# Patient Record
Sex: Female | Born: 1955 | Race: White | Hispanic: No | Smoking: Never smoker
Health system: Southern US, Community
[De-identification: ages and names within clinical notes are randomized; demographics above are authoritative.]

## PROBLEM LIST (undated history)

## (undated) DIAGNOSIS — T7840XA Allergy, unspecified, initial encounter: Secondary | ICD-10-CM

## (undated) DIAGNOSIS — R42 Dizziness and giddiness: Secondary | ICD-10-CM

## (undated) DIAGNOSIS — K5790 Diverticulosis of intestine, part unspecified, without perforation or abscess without bleeding: Secondary | ICD-10-CM

## (undated) DIAGNOSIS — G9332 Myalgic encephalomyelitis/chronic fatigue syndrome: Secondary | ICD-10-CM

## (undated) DIAGNOSIS — J45909 Unspecified asthma, uncomplicated: Secondary | ICD-10-CM

## (undated) DIAGNOSIS — I639 Cerebral infarction, unspecified: Secondary | ICD-10-CM

## (undated) DIAGNOSIS — R5382 Chronic fatigue, unspecified: Secondary | ICD-10-CM

## (undated) DIAGNOSIS — M1990 Unspecified osteoarthritis, unspecified site: Secondary | ICD-10-CM

## (undated) DIAGNOSIS — K529 Noninfective gastroenteritis and colitis, unspecified: Secondary | ICD-10-CM

## (undated) DIAGNOSIS — K219 Gastro-esophageal reflux disease without esophagitis: Secondary | ICD-10-CM

## (undated) DIAGNOSIS — M797 Fibromyalgia: Secondary | ICD-10-CM

## (undated) HISTORY — DX: Unspecified osteoarthritis, unspecified site: M19.90

## (undated) HISTORY — DX: Unspecified asthma, uncomplicated: J45.909

## (undated) HISTORY — DX: Gastro-esophageal reflux disease without esophagitis: K21.9

## (undated) HISTORY — DX: Diverticulosis of intestine, part unspecified, without perforation or abscess without bleeding: K57.90

## (undated) HISTORY — DX: Dizziness and giddiness: R42

## (undated) HISTORY — PX: TONSILLECTOMY: SUR1361

## (undated) HISTORY — DX: Allergy, unspecified, initial encounter: T78.40XA

---

## 1983-07-14 HISTORY — PX: CHOLECYSTECTOMY: SHX55

## 1997-12-11 ENCOUNTER — Emergency Department (HOSPITAL_COMMUNITY): Admission: EM | Admit: 1997-12-11 | Discharge: 1997-12-11 | Payer: Self-pay | Admitting: Internal Medicine

## 1998-04-30 ENCOUNTER — Ambulatory Visit (HOSPITAL_COMMUNITY): Admission: RE | Admit: 1998-04-30 | Discharge: 1998-04-30 | Payer: Self-pay | Admitting: *Deleted

## 1998-06-01 ENCOUNTER — Emergency Department (HOSPITAL_COMMUNITY): Admission: EM | Admit: 1998-06-01 | Discharge: 1998-06-01 | Payer: Self-pay | Admitting: Emergency Medicine

## 2001-07-28 ENCOUNTER — Emergency Department (HOSPITAL_COMMUNITY): Admission: EM | Admit: 2001-07-28 | Discharge: 2001-07-28 | Payer: Self-pay | Admitting: Emergency Medicine

## 2004-12-02 ENCOUNTER — Emergency Department (HOSPITAL_COMMUNITY): Admission: EM | Admit: 2004-12-02 | Discharge: 2004-12-02 | Payer: Self-pay | Admitting: *Deleted

## 2006-06-15 ENCOUNTER — Emergency Department (HOSPITAL_COMMUNITY): Admission: EM | Admit: 2006-06-15 | Discharge: 2006-06-15 | Payer: Self-pay | Admitting: Emergency Medicine

## 2007-06-30 ENCOUNTER — Emergency Department (HOSPITAL_COMMUNITY): Admission: EM | Admit: 2007-06-30 | Discharge: 2007-06-30 | Payer: Self-pay | Admitting: Emergency Medicine

## 2009-01-14 ENCOUNTER — Emergency Department (HOSPITAL_COMMUNITY): Admission: EM | Admit: 2009-01-14 | Discharge: 2009-01-15 | Payer: Self-pay | Admitting: Emergency Medicine

## 2009-10-14 ENCOUNTER — Ambulatory Visit: Payer: Self-pay | Admitting: Physician Assistant

## 2009-10-14 DIAGNOSIS — M1990 Unspecified osteoarthritis, unspecified site: Secondary | ICD-10-CM | POA: Insufficient documentation

## 2009-10-14 DIAGNOSIS — J309 Allergic rhinitis, unspecified: Secondary | ICD-10-CM | POA: Insufficient documentation

## 2009-10-14 DIAGNOSIS — R5382 Chronic fatigue, unspecified: Secondary | ICD-10-CM | POA: Insufficient documentation

## 2009-10-14 DIAGNOSIS — J45909 Unspecified asthma, uncomplicated: Secondary | ICD-10-CM | POA: Insufficient documentation

## 2009-10-14 DIAGNOSIS — IMO0001 Reserved for inherently not codable concepts without codable children: Secondary | ICD-10-CM | POA: Insufficient documentation

## 2009-10-14 DIAGNOSIS — N959 Unspecified menopausal and perimenopausal disorder: Secondary | ICD-10-CM | POA: Insufficient documentation

## 2009-11-05 ENCOUNTER — Encounter: Payer: Self-pay | Admitting: Physician Assistant

## 2009-11-15 ENCOUNTER — Telehealth: Payer: Self-pay | Admitting: Physician Assistant

## 2009-12-20 ENCOUNTER — Encounter: Payer: Self-pay | Admitting: Physician Assistant

## 2009-12-20 ENCOUNTER — Other Ambulatory Visit: Admission: RE | Admit: 2009-12-20 | Discharge: 2009-12-20 | Payer: Self-pay | Admitting: Internal Medicine

## 2009-12-20 ENCOUNTER — Ambulatory Visit: Payer: Self-pay | Admitting: Nurse Practitioner

## 2009-12-20 DIAGNOSIS — N951 Menopausal and female climacteric states: Secondary | ICD-10-CM | POA: Insufficient documentation

## 2009-12-20 LAB — CONVERTED CEMR LAB
Bilirubin Urine: NEGATIVE
Glucose, Urine, Semiquant: NEGATIVE
Ketones, urine, test strip: NEGATIVE
Nitrite: NEGATIVE
Protein, U semiquant: NEGATIVE
Specific Gravity, Urine: <1.005
WBC Urine, dipstick: NEGATIVE

## 2009-12-23 ENCOUNTER — Encounter: Payer: Self-pay | Admitting: Physician Assistant

## 2009-12-24 ENCOUNTER — Encounter (INDEPENDENT_AMBULATORY_CARE_PROVIDER_SITE_OTHER): Payer: Self-pay | Admitting: Nurse Practitioner

## 2009-12-24 LAB — CONVERTED CEMR LAB
ALT: 16 units/L (ref 0–35)
AST: 22 units/L (ref 0–37)
Albumin: 4.8 g/dL (ref 3.5–5.2)
Alkaline Phosphatase: 74 units/L (ref 39–117)
BUN: 14 mg/dL (ref 6–23)
Basophils Relative: 1 % (ref 0–1)
CO2: 19 meq/L (ref 19–32)
Calcium: 10.1 mg/dL (ref 8.4–10.5)
Creatinine, Ser: 0.99 mg/dL (ref 0.40–1.20)
GC Probe Amp, Genital: NEGATIVE
Glucose, Bld: 82 mg/dL (ref 70–99)
Lymphocytes Relative: 25 % (ref 12–46)
MCV: 90.3 fL (ref 78.0–100.0)
RDW: 13.2 % (ref 11.5–15.5)
TSH: 1.164 microintl units/mL (ref 0.350–4.500)
Total Bilirubin: 0.4 mg/dL (ref 0.3–1.2)
Total CHOL/HDL Ratio: 5.6
Total Protein: 7.4 g/dL (ref 6.0–8.3)
Triglycerides: 282 mg/dL — ABNORMAL HIGH (ref <150)
VLDL: 56 mg/dL — ABNORMAL HIGH (ref 0–40)
WBC: 7.1 10*3/uL (ref 4.0–10.5)

## 2009-12-27 ENCOUNTER — Encounter: Payer: Self-pay | Admitting: Physician Assistant

## 2009-12-30 ENCOUNTER — Encounter: Payer: Self-pay | Admitting: Physician Assistant

## 2009-12-31 ENCOUNTER — Telehealth: Payer: Self-pay | Admitting: Physician Assistant

## 2010-01-01 ENCOUNTER — Encounter: Payer: Self-pay | Admitting: Physician Assistant

## 2010-02-05 ENCOUNTER — Ambulatory Visit: Payer: Self-pay | Admitting: Physician Assistant

## 2010-02-05 ENCOUNTER — Encounter (INDEPENDENT_AMBULATORY_CARE_PROVIDER_SITE_OTHER): Payer: Self-pay | Admitting: Nurse Practitioner

## 2010-02-05 DIAGNOSIS — M722 Plantar fascial fibromatosis: Secondary | ICD-10-CM | POA: Insufficient documentation

## 2010-02-05 DIAGNOSIS — E785 Hyperlipidemia, unspecified: Secondary | ICD-10-CM

## 2010-02-05 LAB — CONVERTED CEMR LAB
ALT: 19 units/L (ref 0–35)
AST: 21 units/L (ref 0–37)
Bilirubin, Direct: 0.1 mg/dL (ref 0.0–0.3)
LDL Cholesterol: 94 mg/dL (ref 0–99)
Total Bilirubin: 0.4 mg/dL (ref 0.3–1.2)
Total CHOL/HDL Ratio: 4.2

## 2010-02-06 ENCOUNTER — Encounter (INDEPENDENT_AMBULATORY_CARE_PROVIDER_SITE_OTHER): Payer: Self-pay | Admitting: Nurse Practitioner

## 2010-02-21 ENCOUNTER — Encounter: Payer: Self-pay | Admitting: Physician Assistant

## 2010-06-24 ENCOUNTER — Telehealth (INDEPENDENT_AMBULATORY_CARE_PROVIDER_SITE_OTHER): Payer: Self-pay | Admitting: Internal Medicine

## 2010-08-03 ENCOUNTER — Encounter: Payer: Self-pay | Admitting: Internal Medicine

## 2010-08-12 NOTE — Letter (Signed)
Summary: Handout Printed  Printed Handout:  - Diet - Low-Cholesterol Guidelines 

## 2010-08-12 NOTE — Progress Notes (Signed)
Summary: Form  Phone Note Call from Patient   Summary of Call: The pt states that in her last visit she left a half disability form whit a clinical personnel that should go to the Tax department and she suppostly needs to have done by June so she was wondered if was ready.  Please call her back because nobody called her last time. Alben Spittle PA-c Initial call taken by: Manon Hilding,  December 31, 2009 10:14 AM  Follow-up for Phone Call        no answer, no machine Gaylyn Cheers RN  December 31, 2009 11:43 AM   Additional Follow-up for Phone Call Additional follow up Details #1::        Pt. notified form ready, she requested it be mailed to her @ [address] Manley Mason [phone], done. Additional Follow-up by: Gaylyn Cheers RN,  January 03, 2010 9:44 AM

## 2010-08-12 NOTE — Progress Notes (Signed)
Summary: allergist notes reviewed  Phone Note Outgoing Call   Summary of Call: Prior records reviewed from allergist. Records received not helpful. Asthma and allergies listed in problem list.  Initial call taken by: Brynda Rim,  Nov 15, 2009 3:31 PM

## 2010-08-12 NOTE — Letter (Signed)
Summary: TEST ORDER FORM//MAMMOGRAM//APPT DATE & TIME  TEST ORDER FORM//MAMMOGRAM//APPT DATE & TIME   Imported By: Arta Bruce 12/23/2009 09:28:41  _____________________________________________________________________  External Attachment:    Type:   Image     Comment:   External Document

## 2010-08-12 NOTE — Assessment & Plan Note (Signed)
Summary: 3 WEEK FU/PER NYKEDTRA//////KT   Vital Signs:  Patient profile:   55 year old female Menstrual status:  postmenopausal Height:      63 inches Weight:      139 pounds BMI:     24.71 Temp:     97.7 degrees F oral Pulse rate:   72 / minute Pulse rhythm:   regular Resp:     18 per minute BP sitting:   112 / 74  (left arm) Cuff size:   regular  Vitals Entered By: Armenia Shannon (February 05, 2010 2:07 PM) CC: f/u on chol. Is Patient Diabetic? No Pain Assessment Patient in pain? no       Does patient need assistance? Functional Status Self care Ambulation Normal   Primary Care Provider:  Brynda Rim  CC:  f/u on chol..  History of Present Illness: Here for f/u. Dorma Russell, NP for CPP.  Labs, Pap ok except high chol.  Started on pravastatin.  Here for labs.  Fasting today.  Never got Mammo.  Was put on clondine for hot flashes.  States no help.  Wants hormones.  Does have FHx of CAD (mom with MI in 89s and dad with MI in 78s).  No cigs ever.  No h/o clots.  She has not had a period in 3 years.  No vaginal bleeding.  Refused colo and Td at her CPP.  Still having hot flashes.  Mainly at bedtime.  Keeps her awake.  Cannot sleep.  She has had no improvement with the clonidine.  She has never tried effexor or gabapentin.  Notes a lot of pain from fibromyalgia.  When I first met her she did not want anything for treament.  She was afraid of taking prescription meds.    Notes bilat heel pain.  Worse in am.  Feels stiff.  Gets better with walking.  Problems Prior to Update: 1)  Plantar Fasciitis, Bilateral  (ICD-728.71) 2)  Hyperlipidemia  (ICD-272.4) 3)  Hot Flashes  (ICD-627.2) 4)  Unspecified Breast Screening  (ICD-V76.10) 5)  Routine Gynecological Examination  (ICD-V72.31) 6)  Allergic Rhinitis  (ICD-477.9) 7)  Preventive Health Care  (ICD-V70.0) 8)  Postmenopausal Syndrome  (ICD-627.9) 9)  Family History Diabetes 1st Degree Relative  (ICD-V18.0) 10)   Osteoarthritis  (ICD-715.90) 11)  Asthma  (ICD-493.90) 12)  Chronic Fatigue Syndrome  (ICD-780.71) 13)  Fibromyalgia  (ICD-729.1)  Current Medications (verified): 1)  Calcium 600 Mg Tabs (Calcium) .... Once Daily 2)  Vitamin D 1000 Unit Tabs (Cholecalciferol) .... Once Daily 3)  Proventil Hfa 108 (90 Base) Mcg/act Aers (Albuterol Sulfate) .Marland Kitchen.. 1-2 Puffs Every 4-6 Hours As Needed 4)  Cetirizine Hcl 10 Mg Tabs (Cetirizine Hcl) .... Take 1 Tablet By Mouth Once A Day As Needed For Allergies 5)  Flonase 50 Mcg/act Susp (Fluticasone Propionate) .Marland Kitchen.. 1-2 Sprays Each Nostril Once Daily 6)  Clonidine Hcl 0.1 Mg Tabs (Clonidine Hcl) .... One Tablet By Mouth Nightly For Hot Flashes 7)  Pravastatin Sodium 10 Mg Tabs (Pravastatin Sodium) .... One Tablet By Mouth Nightly For Cholesterol  Allergies (verified): 1)  ! Erythromycin  Physical Exam  General:  alert, well-developed, and well-nourished.   Head:  normocephalic and atraumatic.   Neck:  supple.   Lungs:  normal breath sounds.   Heart:  normal rate and regular rhythm.   Abdomen:  soft.   Msk:  + heel pain with palp bilat  Neurologic:  alert & oriented X3 and cranial nerves II-XII intact.  Psych:  normally interactive.     Impression & Recommendations:  Problem # 1:  HOT FLASHES (ICD-627.2) she does have remote FHx of CAD and has not had a mammo rec she get the mammo would like to avoid HRT if at all possible will try increasing clonidine . . . if no improvement, taper off and  she can try gabapentin . . . may help her fibromyalgia as well denies depression . . . does not want to try Effexor but may consider if gabapentin does not work  Problem # 2:  HYPERLIPIDEMIA (ICD-272.4)  Her updated medication list for this problem includes:    Pravastatin Sodium 10 Mg Tabs (Pravastatin sodium) ..... One tablet by mouth nightly for cholesterol  Orders: T-Lipid Profile (78295-62130) T-Hepatic Function (86578-46962)  Problem # 3:   FIBROMYALGIA (ICD-729.1) try gabapentin  Problem # 4:  PREVENTIVE HEALTH CARE (ICD-V70.0)  rec. she get colo done . . . patient declines she needs to get a mammo . . . has not done yet  Orders: T-Hemoccult Cards-Multiple (82270)  Problem # 5:  PLANTAR FASCIITIS, BILATERAL (ICD-728.71) home exercises given proper footwear discussed  Complete Medication List: 1)  Calcium 600 Mg Tabs (Calcium) .... Once daily 2)  Vitamin D 1000 Unit Tabs (Cholecalciferol) .... Once daily 3)  Proventil Hfa 108 (90 Base) Mcg/act Aers (Albuterol sulfate) .Marland Kitchen.. 1-2 puffs every 4-6 hours as needed 4)  Cetirizine Hcl 10 Mg Tabs (Cetirizine hcl) .... Take 1 tablet by mouth once a day as needed for allergies 5)  Flonase 50 Mcg/act Susp (Fluticasone propionate) .Marland Kitchen.. 1-2 sprays each nostril once daily 6)  Clonidine Hcl 0.1 Mg Tabs (Clonidine hcl) .... One tablet by mouth nightly for hot flashes 7)  Pravastatin Sodium 10 Mg Tabs (Pravastatin sodium) .... One tablet by mouth nightly for cholesterol 8)  Gabapentin 100 Mg Caps (Gabapentin) .... Take 1 cap at bedtime for 3 nights; then increase to 2 caps for 3 nights; then take 3 caps at bedtime every night.  Patient Instructions: 1)  Increase Clonidine to 2 tabs at bedtime.  If this does not work, decrease to 1 tab at bedtime for 2 days, then every other day for 2 doses, then stop. 2)  Then start the gabapentin as directed. 3)  Schedule follow up with Lorin Picket if the treatments we talked about do not work. 4)  Get your mammogram done. 5)  You are due for a colonoscopy.  Call us when you are ready to schedule. 6)  You should get  a tetanus shot.  It is done every 10 years.  Let us know when you are ready. 7)  Complete your hemoccult cards and return them soon.  8)  Do the home exercises for your feet and wear tennis shoes for cushioning at all times.  Flip flops and high heels make this worse. 9)  You can take Aleve 2 tabs two times a day for your foot  pain. Prescriptions: GABAPENTIN 100 MG CAPS (GABAPENTIN) Take 1 cap at bedtime for 3 nights; then increase to 2 caps for 3 nights; then take 3 caps at bedtime every night.  #90 x 3   Entered and Authorized by:   Tereso Newcomer PA-C   Signed by:   Tereso Newcomer PA-C on 02/05/2010   Method used:   Print then Give to Patient   RxID:   [id number]   Appended Document: hemoccult results  Laboratory Results    Stool - Occult Blood Hemmoccult #1: negative Date:  02/19/2010 Hemoccult #2: negative Date: 02/19/2010 Hemoccult #3: negative Date: 02/19/2010

## 2010-08-12 NOTE — Letter (Signed)
Summary: REQUESTING RECORDS FROM DR. BARDELAS  REQUESTING RECORDS FROM DR. BARDELAS   Imported By: Arta Bruce 12/02/2009 12:02:05  _____________________________________________________________________  External Attachment:    Type:   Image     Comment:   External Document

## 2010-08-12 NOTE — Letter (Signed)
Summary: Generic Letter  [facility]  [address] [place], [place] [address]   Phone: [phone]  Fax: [phone]    12/30/2009  To whom it may concern:  Ms. Latreece Mochizuki is a new patient to me.  I have seen her once in April 2011.  I have been given a copy of her adjudication for disability dated 11/23/2008 from the [employer] signed by an Warden/ranger judge.  She was felt to be fully favorable for disability.  With this information, I feel comfortable signing her "Certification of Disability for Property Tax Exclusion."  A copy of her judgement is retained in her chart.  If she needs further information provided or additional records signed, I will have to request she see a provider who specializes in disability determination.  If you have questions, please feel free to contact me.     Sincerely,   Tereso Newcomer PA-C

## 2010-08-12 NOTE — Assessment & Plan Note (Signed)
Summary: Complete Physcial Exam   Vital Signs:  Patient profile:   55 year old female Menstrual status:  postmenopausal Weight:      137.7 pounds BMI:     24.48 BSA:     1.65 Temp:     97.8 degrees F oral Pulse rate:   88 / minute Pulse rhythm:   regular Resp:     20 per minute BP sitting:   160 / 75  (left arm) Cuff size:   regular  Vitals Entered By: Levon Hedger (December 20, 2009 2:20 PM) CC: CPP Is Patient Diabetic? No Pain Assessment Patient in pain? yes     Location: body  Does patient need assistance? Functional Status Self care Ambulation Normal   Primary Care Provider:  Brynda Rim  CC:  CPP.  History of Present Illness:  Pt into the office for a complete physical exam  PAP - last PAP was over 3 years ago no hysterectomy last period was about 3 years Pt with complaints of hot flashes and night sweats.  She has tried over the counter therapies which have not been beneificial  Mammogram - last mammogram was over 20 years ago No family history of breast cancer No self breast exams at home  Optho - last eye exam was 10 months ago.  wears contacts all the time  Dental - last dental exam was 1 year ago       Habits & Providers  Alcohol-Tobacco-Diet     Alcohol drinks/day: 0     Tobacco Status: never  Exercise-Depression-Behavior     Does Patient Exercise: no     Have you felt down or hopeless? no     Have you felt little pleasure in things? no  Comments: PHQ-9 score = 11  Allergies (verified): 1)  ! Erythromycin  Social History: Does Patient Exercise:  no  Review of Systems General:  Complains of sweats; night sweats and hot flashes. Eyes:  Denies discharge. ENT:  Denies earache. CV:  Denies chest pain or discomfort. Resp:  Denies cough. GI:  Denies abdominal pain, nausea, and vomiting. GU:  Denies discharge. MS:  Complains of muscle aches and thoracic pain; denies joint pain. Derm:  Denies flushing. Neuro:  Denies  headaches. Psych:  Complains of anxiety. Endo:  Denies excessive thirst and excessive urination.  Physical Exam  General:  alert.   Head:  normocephalic.   Eyes:  pupils equal and pupils round.   Ears:  ear piercing(s) noted.  bil TM visible Nose:  no nasal discharge.   Mouth:  pharynx pink and moist and fair dentition.   Neck:  supple.   Chest Wall:  no mass.   Breasts:  no abnormal thickening.   Lungs:  normal breath sounds.   Heart:  normal rate and regular rhythm.   Abdomen:  soft, non-tender, and normal bowel sounds.   Rectal:  external hemorrhoid(s).   Msk:  normal ROM.   Pulses:  R radial normal and L radial normal.   Extremities:  no edema Neurologic:  gait normal.   Skin:  color normal.   Psych:  Oriented X3.    Pelvic Exam  Vulva:      normal appearance.   Urethra and Bladder:      Urethra--normal.  Bladder--normal.   Vagina:      post-menopausal.   Cervix:      unable to visualize O's - difficult to see due to exam  Uterus:      smooth.  Adnexa:      nontender bilaterally.   Rectum:      + external hemorrhoids.      Impression & Recommendations:  Problem # 1:  ROUTINE GYNECOLOGICAL EXAMINATION (ICD-V72.31) Labs done  PAP done EKG done  PHQ-9 score = 11 rec routine optho and dental exam Orders: Hemoccult Guaiac-1 spec.(in office) (82270) UA Dipstick w/o Micro (manual) (10258) KOH/ WET [place] [phone]) Pap Smear, Thin Prep ( Collection of) (Q0091) T- GC Chlamydia (24235) T-Lipid Profile (36144-31540) Rapid HIV  (92370) EKG w/ Interpretation (93000)  Problem # 2:  UNSPECIFIED BREAST SCREENING (ICD-V76.10) self breast exams reviewed Orders: Mammogram (Screening) (Mammo)  Problem # 3:  HOT FLASHES (ICD-627.2) will review labs pt is requesting hormone replacement  PAP done she has been taking OTC meds, will add clonidine at night for some symptms of night sweats Orders: T-FSH ([id number])  Complete Medication List: 1)  Calcium 600 Mg  Tabs (Calcium) .... Once daily 2)  Vitamin D 1000 Unit Tabs (Cholecalciferol) .... Once daily 3)  Proventil Hfa 108 (90 Base) Mcg/act Aers (Albuterol sulfate) .Marland Kitchen.. 1-2 puffs every 4-6 hours as needed 4)  Cetirizine Hcl 10 Mg Tabs (Cetirizine hcl) .... Take 1 tablet by mouth once a day as needed for allergies 5)  Flonase 50 Mcg/act Susp (Fluticasone propionate) .Marland Kitchen.. 1-2 sprays each nostril once daily 6)  Clonidine Hcl 0.1 Mg Tabs (Clonidine hcl) .... One tablet by mouth nightly for hot flashes  Other Orders: T-Comprehensive Metabolic Panel [phone]) T-CBC w/Diff ([id number]) T-TSH [phone])  Patient Instructions: 1)  Colonscopy - your last one was over 10 years ago. Be advised that you are due for another one.  let this office know when you are ready to schedule 2)  You will be notified of any abnormal labs 3)  Form - allow 2-3 business days for form to be completed.  Check to see if it is completed on next week. 4)  Tetanus - also be aware that your tetanus is due.  You refused today but you can reconsider at your next visit 5)  Night sweats/hot flashes - would like to review PAP results and labs. Use more caution when starting hormone replacement due to risks vs benefits  will start clonidine 0.1mg  by mouth nightly.  This is a medication that in higher doses is used for blood pressure but is helpful for hot flashes. 6)  Follow up in 3 weeks with Wende Mott for lab results. Prescriptions: CLONIDINE HCL 0.1 MG TABS (CLONIDINE HCL) One tablet by mouth nightly for hot flashes  #30 x 1   Entered and Authorized by:   Lehman Prom FNP   Signed by:   Lehman Prom FNP on 12/20/2009   Method used:   Print then Give to Patient   RxID:   [id number]    Colonoscopy  Procedure date:  07/13/1997  Findings:       Results: Normal.   Comments:      Repeat colonoscopy in 10 years.     Colonoscopy  Procedure date:  07/13/1997  Findings:       Results:  Normal.   Comments:      Repeat colonoscopy in 10 years.     Laboratory Results   Urine Tests  Date/Time Received: December 20, 2009 3:25 PM   Routine Urinalysis   Color: lt. yellow Glucose: negative   (Normal Range: Negative) Bilirubin: negative   (Normal Range: Negative) Ketone: negative   (Normal Range: Negative) Spec. Gravity: <1.005   (Normal  Range: 1.003-1.035) Blood: negative   (Normal Range: Negative) pH: 6.5   (Normal Range: 5.0-8.0) Protein: negative   (Normal Range: Negative) Urobilinogen: 0.2   (Normal Range: 0-1) Nitrite: negative   (Normal Range: Negative) Leukocyte Esterace: negative   (Normal Range: Negative)    Date/Time Received: December 20, 2009 3:46 PM   Other Tests  Rapid HIV: negative   Laboratory Results   Urine Tests    Routine Urinalysis   Color: lt. yellow Glucose: negative   (Normal Range: Negative) Bilirubin: negative   (Normal Range: Negative) Ketone: negative   (Normal Range: Negative) Spec. Gravity: <1.005   (Normal Range: 1.003-1.035) Blood: negative   (Normal Range: Negative) pH: 6.5   (Normal Range: 5.0-8.0) Protein: negative   (Normal Range: Negative) Urobilinogen: 0.2   (Normal Range: 0-1) Nitrite: negative   (Normal Range: Negative) Leukocyte Esterace: negative   (Normal Range: Negative)      Other Tests  Rapid HIV: negative    Prevention & Chronic Care Immunizations   Influenza vaccine: Not documented    Tetanus booster: Not documented   Td booster deferral: Refused  (12/20/2009)    Pneumococcal vaccine: Not documented  Colorectal Screening   Hemoccult: Not documented   Hemoccult action/deferral: Ordered  (12/20/2009)    Colonoscopy:  Results: Normal.   (07/13/1997)   Colonoscopy action/deferral: Repeat colonoscopy in 10 years.    (07/13/1997)   Colonoscopy due: 07/14/2007  Other Screening   Pap smear: Not documented   Pap smear action/deferral: Ordered  (12/20/2009)   Pap smear due: 12/21/2010     Mammogram: Not documented   Mammogram action/deferral: Ordered  (12/20/2009)   Smoking status: never  (12/20/2009)  Lipids   Total Cholesterol: Not documented   Lipid panel action/deferral: Lipid Panel ordered   LDL: Not documented   LDL Direct: Not documented   HDL: Not documented   Triglycerides: Not documented  Appended Document: Complete Physcial Exam    Clinical Lists Changes  Orders: Added new Service order of Rapid HIV  (01093) - Signed Observations: Added new observation of HIVRAPIDRSLT: negative (12/20/2009 16:54)        Laboratory Results  Date/Time Received: December 20, 2009   Other Tests  Rapid HIV: negative

## 2010-08-12 NOTE — Letter (Signed)
Summary: *HSN Results Follow up  [facility]  [address] [place], [place] [address]   Phone: [phone]  Fax: [phone]      02/21/2010   [facility] R Kerney [address] [place], [place]  [address]   Dear  Ms. Quinteria Pasion,                            ____S.Drinkard,FNP   ____D. Gore,FNP       ____B. McPherson,MD   ____V. Rankins,MD    ____E. Mulberry,MD    ____N. Daphine Deutscher, FNP  ____D. Reche Dixon, MD    ____K. Philipp Deputy, MD    __x__S. Alben Spittle, PA-C     This letter is to inform you that your recent test(s):  _______Pap Smear    ___x____Lab Test     _______X-ray    ___x____ is within acceptable limits  _______ requires a medication change  _______ requires a follow-up lab visit  _______ requires a follow-up visit with your provider   Comments:  Stool cards are negative for blood.  Please consider getting the colonoscopy.  I recommend it.  Call us when you are ready.       _________________________________________________________ If you have any questions, please contact our office                     Sincerely,  Tereso Newcomer PA-C [facility]

## 2010-08-12 NOTE — Letter (Signed)
Summary: Lipid Letter  [facility]  [address] [place], [place] [address]   Phone: [phone]  Fax: [phone]    02/06/2010  Tekesha Almgren [address] [place], [place]  [address]  Dear Elnita Maxwell:  We have carefully reviewed your last lipid profile from 02/05/2010 and the results are noted below with a summary of recommendations for lipid management.    Cholesterol:       181     Goal: less than 200   HDL "good" Cholesterol:   43     Goal: greater than 40   LDL "bad" Cholesterol:   94     Goal: less than 130   Triglycerides:       220     Goal: less than 150    Your cholesterol labs have improved since you have started cholesterol medications.  You should continue to take this medication nightly.  Your triglycerides are still high at 220 and should be less than 150.  Remember to eat low fat foods.  This means try to eat baked instead of fried food.  Also you should try to increase your exercise by at least walking continuously for 15-20 minutes four times per week.     Current Medications: 1)    Calcium 600 Mg Tabs (Calcium) .... Once daily 2)    Vitamin D 1000 Unit Tabs (Cholecalciferol) .... Once daily 3)    Proventil Hfa 108 (90 Base) Mcg/act Aers (Albuterol sulfate) .Marland Kitchen.. 1-2 puffs every 4-6 hours as needed 4)    Cetirizine Hcl 10 Mg Tabs (Cetirizine hcl) .... Take 1 tablet by mouth once a day as needed for allergies 5)    Flonase 50 Mcg/act Susp (Fluticasone propionate) .Marland Kitchen.. 1-2 sprays each nostril once daily 6)    Clonidine Hcl 0.1 Mg Tabs (Clonidine hcl) .... One tablet by mouth nightly for hot flashes 7)    Pravastatin Sodium 10 Mg Tabs (Pravastatin sodium) .... One tablet by mouth nightly for cholesterol 8)    Gabapentin 100 Mg Caps (Gabapentin) .... Take 1 cap at bedtime for 3 nights; then increase to 2 caps for 3 nights; then take 3 caps at bedtime every night.  If you have any questions, please call. We appreciate being able to work with you.    Sincerely,    Lehman Prom, FNP [facility]

## 2010-08-12 NOTE — Letter (Signed)
Summary: ALLERGIST NOTES  ALLERGIST NOTES   Imported By: Arta Bruce 11/18/2009 09:00:13  _____________________________________________________________________  External Attachment:    Type:   Image     Comment:   External Document

## 2010-08-12 NOTE — Letter (Signed)
Summary: PT INFORMATION SHEET  PT INFORMATION SHEET   Imported By: Arta Bruce 12/17/2009 14:59:52  _____________________________________________________________________  External Attachment:    Type:   Image     Comment:   External Document

## 2010-08-12 NOTE — Letter (Signed)
Summary: Lipid Letter  [facility]  [address] [place], [place] [address]   Phone: [phone]  Fax: [phone]    12/24/2009  Jessica Randolph [address] [place], [place]  [address]  Dear Jessica Randolph:  We have carefully reviewed your last lipid profile from 12/20/2009 and the results are noted below with a summary of recommendations for lipid management.    Cholesterol:       250     Goal: less than 200   HDL "good" Cholesterol:   45     Goal: greater than 40   LDL "bad" Cholesterol:   149     Goal: less than 130   Triglycerides:       282     Goal: less than 150    Labs done during your recent office visit show your cholesterol is elevated.  You should start a low fat low choleserol diet in addition to cholesterol medications.  Follow up in 6-8 weeks after starting medicatins for repeat cholesterol labs.    Pap Smear results ______________________.     Current Medications: 1)    Calcium 600 Mg Tabs (Calcium) .... Once daily 2)    Vitamin D 1000 Unit Tabs (Cholecalciferol) .... Once daily 3)    Proventil Hfa 108 (90 Base) Mcg/act Aers (Albuterol sulfate) .Marland Kitchen.. 1-2 puffs every 4-6 hours as needed 4)    Cetirizine Hcl 10 Mg Tabs (Cetirizine hcl) .... Take 1 tablet by mouth once a day as needed for allergies 5)    Flonase 50 Mcg/act Susp (Fluticasone propionate) .Marland Kitchen.. 1-2 sprays each nostril once daily 6)    Clonidine Hcl 0.1 Mg Tabs (Clonidine hcl) .... One tablet by mouth nightly for hot flashes 7)    Pravastatin Sodium 10 Mg Tabs (Pravastatin sodium) .... One tablet by mouth nightly for cholesterol  If you have any questions, please call. We appreciate being able to work with you.   Sincerely,    Lehman Prom, FNP [facility]

## 2010-08-12 NOTE — Letter (Signed)
Summary: *HSN Results Follow up  [facility]  [address] [place], [place] [address]   Phone: [phone]  Fax: [phone]      12/27/2009   [facility] R Frisina [address] [place], [place]  [address]   Dear  Ms. Jessica Randolph,                            ____S.Drinkard,FNP   ____D. Gore,FNP       ____B. McPherson,MD   ____V. Rankins,MD    ____E. Mulberry,MD    ____N. Daphine Deutscher, FNP  ____D. Reche Dixon, MD    ____K. Philipp Deputy, MD    __x__S. Alben Spittle, PA-C     This letter is to inform you that your recent test(s):  ___x____Pap Smear    _______Lab Test     _______X-ray    ___x____ is within acceptable limits  _______ requires a medication change  _______ requires a follow-up lab visit  _______ requires a follow-up visit with your provider   Comments:       _________________________________________________________ If you have any questions, please contact our office                     Sincerely,  Tereso Newcomer PA-C [facility]
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
## 2010-08-14 NOTE — Progress Notes (Signed)
Summary: Query:  Refill gabapentin?  Phone Note Outgoing Call   Summary of Call: Refill gabapentin?  Last seen 01/2010. Initial call taken by: Dutch Quint RN,  June 24, 2010 9:46 AM  Follow-up for Phone Call        yes Follow-up by: Julieanne Manson MD,  June 25, 2010 11:16 AM  Additional Follow-up for Phone Call Additional follow up Details #1::        Refill completed.  Dutch Quint RN  June 25, 2010 4:17 PM     Prescriptions: GABAPENTIN 100 MG CAPS (GABAPENTIN) Take 1 cap at bedtime for 3 nights; then increase to 2 caps for 3 nights; then take 3 caps at bedtime every night.  #90 x 3   Entered by:   Dutch Quint RN   Authorized by:   Julieanne Manson MD   Signed by:   Dutch Quint RN on 06/25/2010   Method used:   Electronically to        [facility]  [id number] (retail)       [address]       [place], [place]  [address]       Ph: [phone] or [phone]       Fax: [phone]   RxID:   [id number]

## 2010-08-15 NOTE — Letter (Signed)
Summary: CERTIFICATION FOR DISABILITY  CERTIFICATION FOR DISABILITY   Imported By: Arta Bruce 01/01/2010 15:22:53  _____________________________________________________________________  External Attachment:    Type:   Image     Comment:   External Document

## 2010-08-15 NOTE — Letter (Signed)
Summary: HANDICAPPED PLACARD  HANDICAPPED PLACARD   Imported By: Arta Bruce 12/17/2009 15:06:30  _____________________________________________________________________  External Attachment:    Type:   Image     Comment:   External Document

## 2010-08-15 NOTE — Progress Notes (Signed)
Summary: Office Visit//DEPRESSION SCREENNING  Office Visit//DEPRESSION SCREENNING   Imported By: Arta Bruce 01/21/2010 12:57:04  _____________________________________________________________________  External Attachment:    Type:   Image     Comment:   External Document

## 2010-10-19 LAB — DIFFERENTIAL
Eosinophils Absolute: 0.1 10*3/uL (ref 0.0–0.7)
Eosinophils Relative: 1 % (ref 0–5)
Lymphocytes Relative: 25 % (ref 12–46)
Lymphs Abs: 2 10*3/uL (ref 0.7–4.0)
Monocytes Relative: 4 % (ref 3–12)
Neutro Abs: 5.5 10*3/uL (ref 1.7–7.7)
Neutrophils Relative %: 70 % (ref 43–77)

## 2010-10-19 LAB — POCT I-STAT, CHEM 8
BUN: 13 mg/dL (ref 6–23)
Calcium, Ion: 1.19 mmol/L (ref 1.12–1.32)
Chloride: 105 meq/L (ref 96–112)
Creatinine, Ser: 0.9 mg/dL (ref 0.4–1.2)
Glucose, Bld: 103 mg/dL — ABNORMAL HIGH (ref 70–99)
HCT: 39 % (ref 36.0–46.0)
Hemoglobin: 13.3 g/dL (ref 12.0–15.0)
Potassium: 3.5 meq/L (ref 3.5–5.1)
Sodium: 142 meq/L (ref 135–145)
TCO2: 28 mmol/L (ref 0–100)

## 2010-10-19 LAB — CBC
HCT: 38 % (ref 36.0–46.0)
Hemoglobin: 12.7 g/dL (ref 12.0–15.0)
MCHC: 33.4 g/dL (ref 30.0–36.0)
MCV: 90.8 fL (ref 78.0–100.0)
RDW: 12.9 % (ref 11.5–15.5)
WBC: 7.9 10*3/uL (ref 4.0–10.5)

## 2010-10-19 LAB — RAPID URINE DRUG SCREEN, HOSP PERFORMED
Amphetamines: NOT DETECTED
Benzodiazepines: NOT DETECTED
Tetrahydrocannabinol: NOT DETECTED

## 2010-10-19 LAB — URINALYSIS, ROUTINE W REFLEX MICROSCOPIC
Bilirubin Urine: NEGATIVE
Nitrite: NEGATIVE
Protein, ur: NEGATIVE mg/dL

## 2010-10-19 LAB — URINE MICROSCOPIC-ADD ON

## 2010-10-19 LAB — POCT CARDIAC MARKERS
CKMB, poc: 1.5 ng/mL (ref 1.0–8.0)
Myoglobin, poc: 78.4 ng/mL (ref 12–200)
Troponin i, poc: <0.05 ng/mL (ref 0.00–0.09)

## 2010-10-19 LAB — HEPATIC FUNCTION PANEL
Total Bilirubin: 0.5 mg/dL (ref 0.3–1.2)
Total Protein: 6.9 g/dL (ref 6.0–8.3)

## 2011-04-17 LAB — DIFFERENTIAL
Eosinophils Relative: 2
Lymphs Abs: 1.7
Monocytes Absolute: 0.5
Neutrophils Relative %: 66

## 2011-04-17 LAB — COMPREHENSIVE METABOLIC PANEL
AST: 25
Albumin: 4.3
Alkaline Phosphatase: 78
Calcium: 9.5
Creatinine, Ser: 1
GFR calc Af Amer: >60
GFR calc non Af Amer: 58 — ABNORMAL LOW
Glucose, Bld: 115 — ABNORMAL HIGH
Sodium: 138
Total Bilirubin: 0.8
Total Protein: 7.5

## 2011-04-17 LAB — CBC
HCT: 38.7
MCHC: 34.9
MCV: 89
RDW: 13
WBC: 7

## 2011-04-17 LAB — URINE MICROSCOPIC-ADD ON

## 2011-04-17 LAB — URINALYSIS, ROUTINE W REFLEX MICROSCOPIC
Bilirubin Urine: NEGATIVE
Glucose, UA: NEGATIVE
Protein, ur: NEGATIVE
Urobilinogen, UA: 1

## 2012-03-13 ENCOUNTER — Emergency Department (HOSPITAL_BASED_OUTPATIENT_CLINIC_OR_DEPARTMENT_OTHER)
Admission: EM | Admit: 2012-03-13 | Discharge: 2012-03-13 | Disposition: A | Discharge status: Home / Self Care | Payer: Medicaid Other | Attending: Emergency Medicine | Admitting: Emergency Medicine

## 2012-03-13 ENCOUNTER — Encounter (HOSPITAL_BASED_OUTPATIENT_CLINIC_OR_DEPARTMENT_OTHER): Payer: Self-pay | Admitting: *Deleted

## 2012-03-13 ENCOUNTER — Emergency Department (HOSPITAL_BASED_OUTPATIENT_CLINIC_OR_DEPARTMENT_OTHER): Payer: Medicaid Other

## 2012-03-13 DIAGNOSIS — K5900 Constipation, unspecified: Secondary | ICD-10-CM | POA: Insufficient documentation

## 2012-03-13 DIAGNOSIS — IMO0001 Reserved for inherently not codable concepts without codable children: Secondary | ICD-10-CM | POA: Insufficient documentation

## 2012-03-13 DIAGNOSIS — R5382 Chronic fatigue, unspecified: Secondary | ICD-10-CM | POA: Insufficient documentation

## 2012-03-13 DIAGNOSIS — G9332 Myalgic encephalomyelitis/chronic fatigue syndrome: Secondary | ICD-10-CM | POA: Insufficient documentation

## 2012-03-13 DIAGNOSIS — K5289 Other specified noninfective gastroenteritis and colitis: Secondary | ICD-10-CM | POA: Insufficient documentation

## 2012-03-13 DIAGNOSIS — K529 Noninfective gastroenteritis and colitis, unspecified: Secondary | ICD-10-CM

## 2012-03-13 DIAGNOSIS — R109 Unspecified abdominal pain: Secondary | ICD-10-CM | POA: Insufficient documentation

## 2012-03-13 HISTORY — DX: Myalgic encephalomyelitis/chronic fatigue syndrome: G93.32

## 2012-03-13 HISTORY — DX: Fibromyalgia: M79.7

## 2012-03-13 HISTORY — DX: Chronic fatigue, unspecified: R53.82

## 2012-03-13 LAB — URINALYSIS, ROUTINE W REFLEX MICROSCOPIC
Bilirubin Urine: NEGATIVE
Leukocytes, UA: NEGATIVE
Nitrite: NEGATIVE
Urobilinogen, UA: 0.2 mg/dL (ref 0.0–1.0)

## 2012-03-13 LAB — COMPREHENSIVE METABOLIC PANEL
AST: 21 U/L (ref 0–37)
Albumin: 4.6 g/dL (ref 3.5–5.2)
Alkaline Phosphatase: 72 U/L (ref 39–117)
BUN: 15 mg/dL (ref 6–23)
CO2: 30 mEq/L (ref 19–32)
Chloride: 102 mEq/L (ref 96–112)
Creatinine, Ser: 1.1 mg/dL (ref 0.50–1.10)
GFR calc non Af Amer: 55 mL/min — ABNORMAL LOW (ref >90)
Potassium: 3.7 mEq/L (ref 3.5–5.1)
Total Bilirubin: 0.4 mg/dL (ref 0.3–1.2)

## 2012-03-13 LAB — CBC WITH DIFFERENTIAL/PLATELET
Basophils Absolute: 0 10*3/uL (ref 0.0–0.1)
HCT: 38.8 % (ref 36.0–46.0)
Hemoglobin: 12.8 g/dL (ref 12.0–15.0)
Lymphocytes Relative: 28 % (ref 12–46)
Monocytes Absolute: 0.6 10*3/uL (ref 0.1–1.0)
Monocytes Relative: 8 % (ref 3–12)
Neutro Abs: 4.9 10*3/uL (ref 1.7–7.7)
Neutrophils Relative %: 62 % (ref 43–77)
RBC: 4.19 MIL/uL (ref 3.87–5.11)
WBC: 7.8 10*3/uL (ref 4.0–10.5)

## 2012-03-13 LAB — OCCULT BLOOD X 1 CARD TO LAB, STOOL: Fecal Occult Bld: NEGATIVE

## 2012-03-13 MED ORDER — IOHEXOL 300 MG/ML  SOLN
100.0000 mL | Freq: Once | INTRAMUSCULAR | Status: AC | PRN
  Administered 2012-03-13: 100 mL via INTRAVENOUS

## 2012-03-13 MED ORDER — CIPROFLOXACIN HCL 500 MG PO TABS: 500.0000 mg | ORAL_TABLET | Freq: Two times a day (BID) | ORAL | Status: AC

## 2012-03-13 MED ORDER — METRONIDAZOLE 500 MG PO TABS
500.0000 mg | ORAL_TABLET | Freq: Once | ORAL | Status: DC
  Filled 2012-03-13: qty 1

## 2012-03-13 MED ORDER — SODIUM CHLORIDE 0.9 % IV BOLUS (SEPSIS)
1000.0000 mL | Freq: Once | INTRAVENOUS | Status: AC
  Administered 2012-03-13: 1000 mL via INTRAVENOUS

## 2012-03-13 MED ORDER — ACETAMINOPHEN 325 MG PO TABS
650.0000 mg | ORAL_TABLET | Freq: Once | ORAL | Status: AC
  Administered 2012-03-13: 650 mg via ORAL
  Filled 2012-03-13: qty 2

## 2012-03-13 MED ORDER — METRONIDAZOLE IN NACL 5-0.79 MG/ML-% IV SOLN
500.0000 mg | Freq: Once | INTRAVENOUS | Status: AC
  Administered 2012-03-13: 500 mg via INTRAVENOUS

## 2012-03-13 MED ORDER — ONDANSETRON HCL 4 MG/2ML IJ SOLN
4.0000 mg | Freq: Once | INTRAMUSCULAR | Status: DC
  Filled 2012-03-13: qty 2

## 2012-03-13 MED ORDER — METRONIDAZOLE IN NACL 5-0.79 MG/ML-% IV SOLN
500.0000 mg | Freq: Once | INTRAVENOUS | Status: DC
  Filled 2012-03-13: qty 100

## 2012-03-13 MED ORDER — CIPROFLOXACIN IN D5W 400 MG/200ML IV SOLN
400.0000 mg | Freq: Once | INTRAVENOUS | Status: AC
  Administered 2012-03-13: 400 mg via INTRAVENOUS
  Filled 2012-03-13: qty 200

## 2012-03-13 MED ORDER — METRONIDAZOLE 500 MG PO TABS: 500.0000 mg | ORAL_TABLET | Freq: Two times a day (BID) | ORAL | Status: AC

## 2012-03-13 MED ORDER — OXYCODONE-ACETAMINOPHEN 5-325 MG PO TABS: 1.0000 | ORAL_TABLET | ORAL | Status: AC | PRN

## 2012-03-13 MED ORDER — ONDANSETRON 8 MG PO TBDP: 8.0000 mg | ORAL_TABLET | Freq: Three times a day (TID) | ORAL | Status: AC | PRN

## 2012-03-13 NOTE — ED Notes (Signed)
Patient stated that she was frustrated that it was taking so long and that she wanted her stay in the ER completed by 17:00 because her daughter was leaving for [place].  She stated that her MD told her it would not take long to perform labs and a CT at the ER and wanted an explanation of the time necessary. Explained the steps necessary to the patient and expected time frames. Patient seemed satisfied at this time.

## 2012-03-13 NOTE — ED Notes (Addendum)
Pt states she has had problems with abd pain and constipation x 1 month. Today noticed bloody mucous in stool. Also has hx of hemorrhoids. Color slightly pale. Feeling like she has less energy over the past few months. Sister being treated for parasites.

## 2012-03-13 NOTE — ED Provider Notes (Signed)
History  This chart was scribed for Jessica Quarry, MD by Erskine Emery. This patient was seen in room MH02/MH02 and the patient's care was started at 14:57.   CSN: [id number]  Arrival date & time 03/13/12  1413   First MD Initiated Contact with Patient 03/13/12 1457      Chief Complaint  Patient presents with  . Abdominal Pain    (Consider location/radiation/quality/duration/timing/severity/associated sxs/prior Treatment) Jessica Randolph is a 56 y.o. female who presents to the Emergency Department complaining of sudden onset intermittent lower abdominal pain of a 10/10 severity at its worst, but a 3-4/10 now since 5-6 pm last night while resting. Pt reports an associated bowel movement with blood and mucus this afternoon and some diaphoresis, chills, urinary frequency, and constipation (present for the last month). Pt denies any associated dysuria, hematuria, diarrhea, nausea, emesis, fever, or changes in appetite. She claims nothing seems to aggravate or relieve the pain, including eating or moving. Pt tried chewing some Tums but experienced no relief from symptoms. Pt reports she has never had abdominal pain this severe since episodes of pain associated with her gallbladder before her cholecystectomy. Pt also has a h/o tonsillectomy but no other abdominal surgeries. Pt has a h/o fibromyalgia and allergy issues for which she takes OTC medications. Pt hasn't had her period for about 7-8 years. She has NKDA other than erythromycin and does not smoke or drink.  Patient is a 56 y.o. female presenting with abdominal pain. The history is provided by the patient. No language interpreter was used.  Abdominal Pain The primary symptoms of the illness include abdominal pain. The primary symptoms of the illness do not include fever, vomiting, diarrhea, dysuria or vaginal discharge. The current episode started yesterday. The onset of the illness was sudden.  The patient states that she believes she is currently  not pregnant. Additional symptoms associated with the illness include chills, diaphoresis, constipation and frequency. Symptoms associated with the illness do not include urgency or hematuria.  Pt drove herself here and doesn't have anyone who can drive her home.  Past Medical History  Diagnosis Date  . Fibromyalgia   . CFS (chronic fatigue syndrome)     Past Surgical History  Procedure Date  . Cholecystectomy   . Tonsillectomy     History reviewed. No pertinent family history.  History  Substance Use Topics  . Smoking status: Never Smoker   . Smokeless tobacco: Not on file  . Alcohol Use: No    OB History    Grav Para Term Preterm Abortions TAB SAB Ect Mult Living                  Review of Systems  Constitutional: Positive for chills and diaphoresis. Negative for fever and appetite change.  Gastrointestinal: Positive for abdominal pain and constipation. Negative for vomiting and diarrhea.       Bowel movement with blood and mucus  Genitourinary: Positive for frequency. Negative for dysuria, urgency, hematuria and vaginal discharge.  All other systems reviewed and are negative.    Allergies  Erythromycin  Home Medications  No current outpatient prescriptions on file.  BP 135/88  Pulse 90  Temp 98.4 F (36.9 C) (Oral)  Resp 20  Ht 5\' 3"  (1.6 m)  Wt 135 lb (61.236 kg)  BMI 23.91 kg/m2  SpO2 99%  Physical Exam  Nursing note and vitals reviewed. Constitutional: She is oriented to person, place, and time. She appears well-developed and well-nourished. No distress.  HENT:  Head: Normocephalic and atraumatic.  Eyes: EOM are normal. Pupils are equal, round, and reactive to light.  Neck: Neck supple. No tracheal deviation present.  Cardiovascular: Normal rate.   Pulmonary/Chest: Effort normal. No respiratory distress.  Abdominal: Soft. She exhibits no distension.  Musculoskeletal: Normal range of motion. She exhibits no edema.  Neurological: She is alert and  oriented to person, place, and time.  Skin: Skin is warm and dry.  Psychiatric: She has a normal mood and affect.    ED Course  Procedures (including critical care time)  DIAGNOSTIC STUDIES: Oxygen Saturation is 99% on room air, normal by my interpretation.    COORDINATION OF CARE: 15:05--I evaluated the patient and we discussed a treatment plan including blood work, urinalysis, Zofran, IV fluids, and an abdominal CT to which the pt agreed.      Labs Reviewed  URINALYSIS, ROUTINE W REFLEX MICROSCOPIC   No results found.   No diagnosis found.  Results for orders placed during the hospital encounter of 03/13/12  URINALYSIS, ROUTINE W REFLEX MICROSCOPIC      Component Value Range   Color, Urine YELLOW  YELLOW   APPearance CLEAR  CLEAR   Specific Gravity, Urine 1.024  1.005 - 1.030   pH 7.5  5.0 - 8.0   Glucose, UA NEGATIVE  NEGATIVE mg/dL   Hgb urine dipstick NEGATIVE  NEGATIVE   Bilirubin Urine NEGATIVE  NEGATIVE   Ketones, ur NEGATIVE  NEGATIVE mg/dL   Protein, ur NEGATIVE  NEGATIVE mg/dL   Urobilinogen, UA 0.2  0.0 - 1.0 mg/dL   Nitrite NEGATIVE  NEGATIVE   Leukocytes, UA NEGATIVE  NEGATIVE  OCCULT BLOOD X 1 CARD TO LAB, STOOL      Component Value Range   Fecal Occult Bld NEGATIVE    CBC WITH DIFFERENTIAL      Component Value Range   WBC 7.8  4.0 - 10.5 K/uL   RBC 4.19  3.87 - 5.11 MIL/uL   Hemoglobin 12.8  12.0 - 15.0 g/dL   HCT 16.1  09.6 - 04.5 %   MCV 92.6  78.0 - 100.0 fL   MCH 30.5  26.0 - 34.0 pg   MCHC 33.0  30.0 - 36.0 g/dL   RDW 40.9  81.1 - 91.4 %   Platelets 257  150 - 400 K/uL   Neutrophils Relative 62  43 - 77 %   Neutro Abs 4.9  1.7 - 7.7 K/uL   Lymphocytes Relative 28  12 - 46 %   Lymphs Abs 2.2  0.7 - 4.0 K/uL   Monocytes Relative 8  3 - 12 %   Monocytes Absolute 0.6  0.1 - 1.0 K/uL   Eosinophils Relative 1  0 - 5 %   Eosinophils Absolute 0.1  0.0 - 0.7 K/uL   Basophils Relative 0  0 - 1 %   Basophils Absolute 0.0  0.0 - 0.1 K/uL    COMPREHENSIVE METABOLIC PANEL      Component Value Range   Sodium 140  135 - 145 mEq/L   Potassium 3.7  3.5 - 5.1 mEq/L   Chloride 102  96 - 112 mEq/L   CO2 30  19 - 32 mEq/L   Glucose, Bld 99  70 - 99 mg/dL   BUN 15  6 - 23 mg/dL   Creatinine, Ser 7.82  0.50 - 1.10 mg/dL   Calcium 95.6 (*) 8.4 - 10.5 mg/dL   Total Protein 7.4  6.0 - 8.3 g/dL  Albumin 4.6  3.5 - 5.2 g/dL   AST 21  0 - 37 U/L   ALT 17  0 - 35 U/L   Alkaline Phosphatase 72  39 - 117 U/L   Total Bilirubin 0.4  0.3 - 1.2 mg/dL   GFR calc non Af Amer 55 (*) >90 mL/min   GFR calc Af Amer 64 (*) >90 mL/min  LIPASE, BLOOD      Component Value Range   Lipase 38  11 - 59 U/L   Ct Abdomen Pelvis W Contrast  03/13/2012  *RADIOLOGY REPORT*  Clinical Data: Progressive abdominal pain for several months with nausea, vomiting and blood in stool.  History of cholecystectomy.  CT ABDOMEN AND PELVIS WITH CONTRAST  Technique:  Multidetector CT imaging of the abdomen and pelvis was performed following the standard protocol during bolus administration of intravenous contrast.  Contrast: OMNIPAQUE IOHEXOL 300 MG/ML  SOLN  Comparison: Abdominal pelvic CT 06/30/2007.  Findings: The lung bases are clear.  There is no pleural effusion. There is stable mild biliary dilatation status post cholecystectomy. The liver, spleen and pancreas appear unremarkable.  The adrenal glands and kidneys appear stable.  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  The stomach, small bowel and appendix appear normal.  The proximal to mid colon appears normal aside from stool in the transverse colon. There is mild descending colon diverticulosis without surrounding inflammation.  The sigmoid colon and rectum demonstrate mild diffuse wall thickening.  There is no evidence of bowel obstruction or extraluminal fluid collection.  The uterus, ovaries and bladder appear unremarkable.  There is no adenopathy or ascites.  There is no evidence of large vessel occlusion.  There  are minimal degenerative changes of the lower lumbar spine.  IMPRESSION:  1. Sigmoid and rectal wall thickening suggesting mild distal colitis in this clinical context. No evidence of bowel obstruction or perforation. 2.  The small bowel and appendix appear normal. 3.  No significant biliary dilatation status post cholecystectomy.   Original Report Authenticated By: Gerrianne Scale, M.D.    Recent given IV Cipro. I have discussed with her the results of her tests. She requests that the Flagyl be given by mouth. I have discussed with her the risks and benefits including the fact that she could vomit the medication. She voices understanding of this and still wishes to switch over to the oral Flagyl. She is given Cipro and Flagyl take at home. I discussed with her any signs or symptoms to return to the hospital for her. She understands that she should follow with her primary care Dr. on Tuesday. MDM  I personally performed the services described in this documentation, which was scribed in my presence. The recorded information has been reviewed and considered.        I personally performed the services described in this documentation, which was scribed in my presence. The recorded information has been reviewed and considered.   Jessica Quarry, MD 03/13/12 2322

## 2012-03-13 NOTE — ED Notes (Signed)
Pt reports having abdominal pain that has been present for a couple of months but has progressively gotten worse.  Tender to touch,  pain 10/10.  Pt reports constipation but had a bowel movement this morning where she found blood.  Describes it as mucousy.  Pt has also had n/v for a couple of months  with 2-3 episodes of vomiting a week. Pt reports taking ibuprofen 400 mg every night for at least 1-2 years.  Nad.

## 2012-03-13 NOTE — ED Notes (Signed)
Provided a warm pack for patients arm over IV site & patient requested additional IV in order to run additional IV at the concurrently

## 2013-04-14 ENCOUNTER — Emergency Department (HOSPITAL_BASED_OUTPATIENT_CLINIC_OR_DEPARTMENT_OTHER)
Admission: EM | Admit: 2013-04-14 | Discharge: 2013-04-14 | Disposition: A | Discharge status: Home / Self Care | Payer: Medicaid Other | Attending: Emergency Medicine | Admitting: Emergency Medicine

## 2013-04-14 ENCOUNTER — Emergency Department (HOSPITAL_BASED_OUTPATIENT_CLINIC_OR_DEPARTMENT_OTHER): Payer: Medicaid Other

## 2013-04-14 ENCOUNTER — Encounter (HOSPITAL_BASED_OUTPATIENT_CLINIC_OR_DEPARTMENT_OTHER): Payer: Self-pay

## 2013-04-14 DIAGNOSIS — Z79899 Other long term (current) drug therapy: Secondary | ICD-10-CM | POA: Insufficient documentation

## 2013-04-14 DIAGNOSIS — Z8719 Personal history of other diseases of the digestive system: Secondary | ICD-10-CM | POA: Insufficient documentation

## 2013-04-14 DIAGNOSIS — IMO0001 Reserved for inherently not codable concepts without codable children: Secondary | ICD-10-CM | POA: Insufficient documentation

## 2013-04-14 DIAGNOSIS — R112 Nausea with vomiting, unspecified: Secondary | ICD-10-CM | POA: Insufficient documentation

## 2013-04-14 HISTORY — DX: Noninfective gastroenteritis and colitis, unspecified: K52.9

## 2013-04-14 LAB — CBC WITH DIFFERENTIAL/PLATELET
Basophils Absolute: 0 10*3/uL (ref 0.0–0.1)
Basophils Relative: 0 % (ref 0–1)
Eosinophils Absolute: 0.1 10*3/uL (ref 0.0–0.7)
Eosinophils Relative: 1 % (ref 0–5)
HCT: 47.2 % — ABNORMAL HIGH (ref 36.0–46.0)
Lymphocytes Relative: 15 % (ref 12–46)
MCHC: 32.4 g/dL (ref 30.0–36.0)
MCV: 93.3 fL (ref 78.0–100.0)
Monocytes Absolute: 0.7 10*3/uL (ref 0.1–1.0)
Platelets: 302 10*3/uL (ref 150–400)
RDW: 13.4 % (ref 11.5–15.5)
WBC: 10.4 10*3/uL (ref 4.0–10.5)

## 2013-04-14 LAB — COMPREHENSIVE METABOLIC PANEL
ALT: 22 U/L (ref 0–35)
AST: 28 U/L (ref 0–37)
Albumin: 5.2 g/dL (ref 3.5–5.2)
Calcium: 11 mg/dL — ABNORMAL HIGH (ref 8.4–10.5)
Creatinine, Ser: 1.1 mg/dL (ref 0.50–1.10)
GFR calc non Af Amer: 55 mL/min — ABNORMAL LOW (ref >90)
Sodium: 143 mEq/L (ref 135–145)
Total Protein: 8.6 g/dL — ABNORMAL HIGH (ref 6.0–8.3)

## 2013-04-14 MED ORDER — HYDROMORPHONE HCL PF 1 MG/ML IJ SOLN
0.5000 mg | Freq: Once | INTRAMUSCULAR | Status: AC
  Administered 2013-04-14: 1 mg via INTRAVENOUS
  Filled 2013-04-14: qty 1

## 2013-04-14 MED ORDER — ONDANSETRON HCL 4 MG/2ML IJ SOLN
4.0000 mg | Freq: Once | INTRAMUSCULAR | Status: AC
  Administered 2013-04-14: 4 mg via INTRAVENOUS
  Filled 2013-04-14: qty 2

## 2013-04-14 MED ORDER — ONDANSETRON 8 MG PO TBDP: 8.0000 mg | ORAL_TABLET | Freq: Three times a day (TID) | ORAL | Status: DC | PRN

## 2013-04-14 MED ORDER — OXYCODONE-ACETAMINOPHEN 5-325 MG PO TABS: 1.0000 | ORAL_TABLET | ORAL | Status: DC | PRN

## 2013-04-14 MED ORDER — SODIUM CHLORIDE 0.9 % IV BOLUS (SEPSIS)
1000.0000 mL | Freq: Once | INTRAVENOUS | Status: AC
  Administered 2013-04-14: 1000 mL via INTRAVENOUS

## 2013-04-14 NOTE — ED Notes (Signed)
C/o vomiting, abd pain, chest and throat burning, right ear ache x 2 days

## 2013-04-15 NOTE — ED Provider Notes (Signed)
CSN: [id number]     Arrival date & time 04/14/13  1415 History   First MD Initiated Contact with Patient 04/14/13 1506     Chief Complaint  Patient presents with  . Emesis   (Consider location/radiation/quality/duration/timing/severity/associated sxs/prior Treatment) HPI  Patient complaining of nausea and vomiting with coughing for the past 2 days. She has not had fever or chills. It was preceded by some operatory infection symptoms. She has not had any diarrhea. She has been drinking clear liquids without difficulty. She has not had abdominal pain with this. She has not had similar symptoms in the past she has not had any known sick contacts. She has not been traveling recently. She has not taken any medication or attempt any intervention to  Past Medical History  Diagnosis Date  . Fibromyalgia   . CFS (chronic fatigue syndrome)   . Colitis    Past Surgical History  Procedure Laterality Date  . Cholecystectomy    . Tonsillectomy     No family history on file. History  Substance Use Topics  . Smoking status: Never Smoker   . Smokeless tobacco: Not on file  . Alcohol Use: No   OB History   Grav Para Term Preterm Abortions TAB SAB Ect Mult Living                 Review of Systems  All other systems reviewed and are negative.    Allergies  Erythromycin  Home Medications   Current Outpatient Rx  Name  Route  Sig  Dispense  Refill  . albuterol (PROVENTIL HFA;VENTOLIN HFA) 108 (90 BASE) MCG/ACT inhaler   Inhalation   Inhale 2 puffs into the lungs every 6 (six) hours as needed. For wheezing and shortness of breath.         Marland Kitchen ibuprofen (ADVIL,MOTRIN) 200 MG tablet   Oral   Take 400 mg by mouth every 6 (six) hours as needed. For pain.         Marland Kitchen loratadine (CLARITIN) 10 MG tablet   Oral   Take 10 mg by mouth daily.         . ondansetron (ZOFRAN ODT) 8 MG disintegrating tablet   Oral   Take 1 tablet (8 mg total) by mouth every 8 (eight) hours as needed for  nausea.   20 tablet   0   . oxyCODONE-acetaminophen (PERCOCET/ROXICET) 5-325 MG per tablet   Oral   Take 1 tablet by mouth every 4 (four) hours as needed for pain.   6 tablet   0    BP 114/70  Pulse 78  Temp(Src) 98.4 F (36.9 C) (Oral)  Resp 20  Ht 5\' 3"  (1.6 m)  Wt 135 lb (61.236 kg)  BMI 23.92 kg/m2  SpO2 99% Physical Exam  Nursing note and vitals reviewed. Constitutional: She is oriented to person, place, and time. She appears well-developed and well-nourished.  HENT:  Head: Normocephalic and atraumatic.  Right Ear: External ear normal.  Left Ear: External ear normal.  Nose: Nose normal.  Mouth/Throat: Oropharynx is clear and moist.  Eyes: Conjunctivae and EOM are normal. Pupils are equal, round, and reactive to light.  Neck: Normal range of motion. Neck supple. No JVD present. No tracheal deviation present. No thyromegaly present.  Cardiovascular: Normal rate, regular rhythm, normal heart sounds and intact distal pulses.   Pulmonary/Chest: Effort normal and breath sounds normal. No respiratory distress. She has no wheezes.  Abdominal: Soft. Bowel sounds are normal. She exhibits no mass.  There is no tenderness. There is no guarding.  Musculoskeletal: Normal range of motion.  Lymphadenopathy:    She has no cervical adenopathy.  Neurological: She is alert and oriented to person, place, and time. She has normal reflexes. No cranial nerve deficit or sensory deficit. Gait normal. GCS eye subscore is 4. GCS verbal subscore is 5. GCS motor subscore is 6.  Reflex Scores:      Bicep reflexes are 2+ on the right side and 2+ on the left side.      Patellar reflexes are 2+ on the right side and 2+ on the left side. Strength is 5/5 bilateral elbow flexor/extensors, wrist extension/flexion, intrinsic hand strength equal Bilateral hip flexion/extension 5/5, knee flexion/extension 5/5, ankle 5/5 flexion extension    Skin: Skin is warm and dry.  Psychiatric: She has a normal mood and  affect. Her behavior is normal. Judgment and thought content normal.    ED Course  Procedures (including critical care time) Labs Review Labs Reviewed  CBC WITH DIFFERENTIAL - Abnormal; Notable for the following:    Hemoglobin 15.3 (*)    HCT 47.2 (*)    Neutro Abs 8.1 (*)    All other components within normal limits  COMPREHENSIVE METABOLIC PANEL - Abnormal; Notable for the following:    Glucose, Bld 130 (*)    Calcium 11.0 (*)    Total Protein 8.6 (*)    GFR calc non Af Amer 55 (*)    GFR calc Af Amer 64 (*)    All other components within normal limits  LIPASE, BLOOD   Imaging Review Dg Chest 2 View  04/14/2013   CLINICAL DATA:  Cough, vomiting and right-sided chest pain.  EXAM: CHEST - 2 VIEW  COMPARISON:  None  FINDINGS: The heart size and mediastinal contours are within normal limits. Both lungs are clear. The visualized skeletal structures are unremarkable.  IMPRESSION: No active disease.   Electronically Signed   By: Irish Lack M.D.   On: 04/14/2013 15:54    MDM   1. Nausea and vomiting       Hilario Quarry, MD 04/15/13 0003

## 2015-01-28 ENCOUNTER — Ambulatory Visit: Payer: Medicaid Other | Attending: Orthopedic Surgery | Admitting: Physical Therapy

## 2015-01-28 ENCOUNTER — Encounter: Payer: Self-pay | Admitting: Physical Therapy

## 2015-01-28 DIAGNOSIS — M542 Cervicalgia: Secondary | ICD-10-CM | POA: Diagnosis not present

## 2015-01-28 DIAGNOSIS — M62838 Other muscle spasm: Secondary | ICD-10-CM

## 2015-01-28 DIAGNOSIS — M6248 Contracture of muscle, other site: Secondary | ICD-10-CM | POA: Diagnosis not present

## 2015-01-28 DIAGNOSIS — M545 Low back pain, unspecified: Secondary | ICD-10-CM
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
## 2015-02-04 ENCOUNTER — Ambulatory Visit: Payer: Medicaid Other | Admitting: Physical Therapy

## 2015-02-04 ENCOUNTER — Encounter: Payer: Self-pay | Admitting: Physical Therapy

## 2015-02-04 DIAGNOSIS — M542 Cervicalgia: Secondary | ICD-10-CM | POA: Diagnosis not present

## 2015-02-04 DIAGNOSIS — M545 Low back pain, unspecified: Secondary | ICD-10-CM

## 2015-02-04 DIAGNOSIS — M62838 Other muscle spasm: Secondary | ICD-10-CM

## 2015-02-04 NOTE — Therapy (Signed)
[facility] [place] [address] [place], [place], [address] Phone: [phone]   Fax:  [phone]  Physical Therapy Treatment  Patient Details  Name: Jessica Randolph MRN: [id number] Date of Birth: 29-Dec-1955 Referring Provider:  Ollen Gross, MD  Encounter Date: 02/04/2015      PT End of Session - 02/04/15 1150    Visit Number 2   Date for PT Re-Evaluation 03/31/15   PT Start Time 1103   PT Stop Time 1201   PT Time Calculation (min) 58 min      Past Medical History  Diagnosis Date  . Fibromyalgia   . CFS (chronic fatigue syndrome)   . Colitis     Past Surgical History  Procedure Laterality Date  . Cholecystectomy    . Tonsillectomy      There were no vitals filed for this visit.  Visit Diagnosis:  Neck pain  Muscle spasms of head or neck  Midline low back pain without sciatica      Subjective Assessment - 02/04/15 1103    Subjective Pt reports that she continues to have neck and back pain. Pt does reports that she struggles with household activities    Currently in Pain? Yes   Pain Score 8    Pain Location Neck   Pain Orientation Upper   Pain Descriptors / Indicators Burning;Spasm   Pain Type Acute pain   Pain Onset More than a month ago                         [facility] Adult PT Treatment/Exercise - 02/04/15 0001    Lumbar Exercises: Aerobic   Elliptical NuStep L2 x7min   Modalities   Modalities Moist Heat;Ultrasound   Moist Heat Therapy   Number Minutes Moist Heat 15 Minutes   Moist Heat Location Cervical   Ultrasound   Ultrasound Location Upper traps    Ultrasound Parameters 1cm/2    Ultrasound Goals Pain   Manual Therapy   Manual Therapy Soft tissue mobilization;Myofascial release;Manual Traction;Muscle Energy Technique   Manual therapy comments Tightness in STM,  upper and mid traps    Soft tissue mobilization cervical par spinals    Manual Traction Cervical spine     Muscle Energy Technique Rotation and side bending  of cervical spine                   PT Short Term Goals - 02/04/15 1152    PT SHORT TERM GOAL #1   Title independent iwth initial HEP   Status On-going           PT Long Term Goals - 02/04/15 1152    PT LONG TERM GOAL #1   Title increase cervical ROM 25%   Status On-going               Plan - 02/04/15 1151    Clinical Impression Statement Pt does report pain with soft tissue mobilization. Pt stated "I don't like to hurt" Pt reports that she has been non compliant with hep because some if the exercises make her hurt. Pt educated on the process of PT and explained that she id responsible for her therapy. Pt encouraged to perform HEP.   Pt will benefit from skilled therapeutic intervention in order to improve on the following deficits Decreased range of motion;Increased muscle spasms;Improper body mechanics;Pain;Decreased strength   PT Frequency 2x / week   PT Duration 8  weeks   PT Treatment/Interventions ADLs/Self Care Home Management;Electrical Stimulation;Moist Heat;Ultrasound;Traction;Therapeutic exercise;Therapeutic activities;Functional mobility training;Patient/family education;Manual techniques;Taping   PT Next Visit Plan slowly add exercises        Problem List Patient Active Problem List   Diagnosis Date Noted  . HYPERLIPIDEMIA 02/05/2010  . PLANTAR FASCIITIS, BILATERAL 02/05/2010  . HOT FLASHES 12/20/2009  . ALLERGIC RHINITIS 10/14/2009  . ASTHMA 10/14/2009  . POSTMENOPAUSAL SYNDROME 10/14/2009  . OSTEOARTHRITIS 10/14/2009  . FIBROMYALGIA 10/14/2009  . CHRONIC FATIGUE SYNDROME 10/14/2009    Grayce Sessions, PTA 02/04/2015, 11:57 AM  [facility] [place] [address] [place], [place], [address] Phone: [phone]   Fax:  [phone]

## 2015-02-06 ENCOUNTER — Ambulatory Visit: Payer: Medicaid Other | Admitting: Physical Therapy

## 2015-02-11 ENCOUNTER — Encounter: Payer: Self-pay | Admitting: Physical Therapy

## 2015-02-11 ENCOUNTER — Ambulatory Visit: Payer: Medicaid Other | Attending: Orthopedic Surgery | Admitting: Physical Therapy

## 2015-02-11 DIAGNOSIS — M6248 Contracture of muscle, other site: Secondary | ICD-10-CM | POA: Diagnosis present

## 2015-02-11 DIAGNOSIS — M542 Cervicalgia: Secondary | ICD-10-CM | POA: Insufficient documentation

## 2015-02-11 DIAGNOSIS — M62838 Other muscle spasm: Secondary | ICD-10-CM

## 2015-02-11 DIAGNOSIS — M545 Low back pain, unspecified: Secondary | ICD-10-CM

## 2015-02-11 NOTE — Therapy (Addendum)
[place], [place], [address] Phone: [phone]   Fax:  [phone]  Physical Therapy Treatment  Patient Details  Name: Jessica Randolph MRN: [id number] Date of Birth: 10-Jun-1956 Referring Provider:  Gaynelle Arabian, MD  Encounter Date: 02/11/2015      PT End of Session - 02/11/15 1142    Visit Number 3   Date for PT Re-Evaluation 03/31/15   PT Start Time 1059   PT Stop Time 1142   PT Time Calculation (min) 43 min   Activity Tolerance Patient tolerated treatment well   Behavior During Therapy [facility] for tasks assessed/performed      Past Medical History  Diagnosis Date  . Fibromyalgia   . CFS (chronic fatigue syndrome)   . Colitis     Past Surgical History  Procedure Laterality Date  . Cholecystectomy    . Tonsillectomy      There were no vitals filed for this visit.  Visit Diagnosis:  Neck pain  Muscle spasms of head or neck  Midline low back pain without sciatica      Subjective Assessment - 02/11/15 1105    Subjective Patient reports that she was sore after last visit.  Nothing has helped so far.   Currently in Pain? Yes   Pain Score 6    Pain Location Neck   Pain Orientation Upper   Pain Descriptors / Indicators Burning;Spasm   Pain Type Acute pain   Aggravating Factors  activity   Effect of Pain on Daily Activities pain meds                         [facility] Adult PT Treatment/Exercise - 02/11/15 0001    Neck Exercises: Standing   Neck Retraction 10 reps   Other Standing Exercises 3# shrugs, with upper trap and levator stretches   Neck Exercises: Seated   Shoulder Shrugs 10 reps   Other Seated Exercise yellow t band scapular retraction 2 ways   Lumbar Exercises: Aerobic   Elliptical NuStep L3 x54min   Manual Therapy   Manual Therapy Soft tissue mobilization;Myofascial release;Manual Traction;Muscle Energy Technique   Manual Traction Cervical spine    Muscle Energy Technique Rotation and side bending  of cervical spine    Neck Exercises: Stretches   Upper Trapezius Stretch 10 seconds;3 reps   Levator Stretch 10 seconds;3 reps                  PT Short Term Goals - 02/04/15 1152    PT SHORT TERM GOAL #1   Title independent iwth initial HEP   Status On-going           PT Long Term Goals - 02/04/15 1152    PT LONG TERM GOAL #1   Title increase cervical ROM 25%   Status On-going               Plan - 02/11/15 1143    Clinical Impression Statement Reports some increased pain with any motions and exercises.  C/O numbness and tingling in hands and feet.  C/O HA with cervical rotation.    PT Next Visit Plan may write note to MD next visit and have her return to MD if no changes   Consulted and Agree with Plan of Care Patient        Problem List Patient Active Problem List   Diagnosis Date Noted  . HYPERLIPIDEMIA 02/05/2010  .  PLANTAR FASCIITIS, BILATERAL 02/05/2010  . HOT FLASHES 12/20/2009  . ALLERGIC RHINITIS 10/14/2009  . ASTHMA 10/14/2009  . POSTMENOPAUSAL SYNDROME 10/14/2009  . OSTEOARTHRITIS 10/14/2009  . FIBROMYALGIA 10/14/2009  . CHRONIC FATIGUE SYNDROME 10/14/2009    Sumner Boast., PT 02/11/2015, 11:51 AM  [address] [address] [place], [place], [address] Phone: [phone]   Fax:  [phone]     PHYSICAL THERAPY DISCHARGE SUMMARY  Visits from Start of Care: 3  Current functional level related to goals / functional outcomes: Improved symptoms    Remaining deficits: Some continued pain and dysfunction   Education / Equipment: HEP  Plan: Patient agrees to discharge.  Patient goals were partially met. Patient is being discharged due to not returning since the last visit.  ?????    Celyn P. Helene Kelp PT, MPH 05/29/2015 11:37 AM

## 2015-02-13 ENCOUNTER — Ambulatory Visit: Payer: Medicaid Other | Admitting: Physical Therapy

## 2015-04-03 ENCOUNTER — Ambulatory Visit (INDEPENDENT_AMBULATORY_CARE_PROVIDER_SITE_OTHER): Payer: Medicaid Other | Admitting: Neurology

## 2015-04-03 ENCOUNTER — Encounter: Payer: Self-pay | Admitting: Neurology

## 2015-04-03 VITALS — BP 115/81 | HR 111 | Ht 63.0 in | Wt 137.8 lb

## 2015-04-03 DIAGNOSIS — R531 Weakness: Secondary | ICD-10-CM

## 2015-04-03 DIAGNOSIS — R202 Paresthesia of skin: Secondary | ICD-10-CM

## 2015-04-03 DIAGNOSIS — G8190 Hemiplegia, unspecified affecting unspecified side: Secondary | ICD-10-CM | POA: Diagnosis not present

## 2015-04-03 DIAGNOSIS — R208 Other disturbances of skin sensation: Secondary | ICD-10-CM

## 2015-04-03 DIAGNOSIS — I638 Other cerebral infarction: Secondary | ICD-10-CM

## 2015-04-03 DIAGNOSIS — H8110 Benign paroxysmal vertigo, unspecified ear: Secondary | ICD-10-CM | POA: Diagnosis not present

## 2015-04-03 DIAGNOSIS — I639 Cerebral infarction, unspecified: Secondary | ICD-10-CM | POA: Diagnosis not present

## 2015-04-03 DIAGNOSIS — R200 Anesthesia of skin: Secondary | ICD-10-CM | POA: Diagnosis not present

## 2015-04-03 DIAGNOSIS — M6289 Other specified disorders of muscle: Secondary | ICD-10-CM

## 2015-04-03 DIAGNOSIS — R42 Dizziness and giddiness: Secondary | ICD-10-CM | POA: Diagnosis not present

## 2015-04-03 DIAGNOSIS — I6389 Other cerebral infarction: Secondary | ICD-10-CM

## 2015-04-03 DIAGNOSIS — I7774 Dissection of vertebral artery: Secondary | ICD-10-CM

## 2015-04-03 NOTE — Patient Instructions (Signed)
Overall you are doing fairly well but I do want to suggest a few things today:   Remember to drink plenty of fluid, eat healthy meals and do not skip any meals. Try to eat protein with a every meal and eat a healthy snack such as fruit or nuts in between meals. Try to keep a regular sleep-wake schedule and try to exercise daily, particularly in the form of walking, 20-30 minutes a day, if you can.   As far as diagnostic testing: imaging of the brain, blood vessels in the brain and neck  I would like to see you back after workup complete, sooner if we need to. Please call us with any interim questions, concerns, problems, updates or refill requests.   Our phone number is [phone]. We also have an after hours call service for urgent matters and there is a physician on-call for urgent questions. For any emergencies you know to call 911 or go to the nearest emergency room
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
## 2015-04-04 ENCOUNTER — Telehealth: Payer: Self-pay | Admitting: *Deleted

## 2015-04-04 LAB — BASIC METABOLIC PANEL
BUN/Creatinine Ratio: 17 (ref 9–23)
BUN: 12 mg/dL (ref 6–24)
CALCIUM: 9.8 mg/dL (ref 8.7–10.2)
CHLORIDE: 102 mmol/L (ref 97–108)
CO2: 25 mmol/L (ref 18–29)
Creatinine, Ser: 0.71 mg/dL (ref 0.57–1.00)
GFR calc non Af Amer: 94 mL/min/{1.73_m2} (ref >59)
GFR, EST AFRICAN AMERICAN: 109 mL/min/{1.73_m2} (ref >59)
Glucose: 85 mg/dL (ref 65–99)
POTASSIUM: 4.1 mmol/L (ref 3.5–5.2)
Sodium: 141 mmol/L (ref 134–144)

## 2015-04-04 NOTE — Telephone Encounter (Signed)
-----   Message from Anson Fret, MD sent at 04/04/2015 10:35 AM EDT ----- BMP normal thanks

## 2015-04-04 NOTE — Telephone Encounter (Signed)
Home number has been disconnected. Will try other number.

## 2015-04-04 NOTE — Telephone Encounter (Signed)
LVM for pt to call back about results. Gave [facility] phone number and office hours. Okay to inform pt lab work was normal.

## 2015-04-07 DIAGNOSIS — R202 Paresthesia of skin: Secondary | ICD-10-CM | POA: Insufficient documentation

## 2015-04-07 DIAGNOSIS — R531 Weakness: Secondary | ICD-10-CM | POA: Insufficient documentation

## 2015-04-08 NOTE — Telephone Encounter (Signed)
Pt returned call from nurse. She was told that her lab work was normal as per tele note from nurse.

## 2015-04-11 ENCOUNTER — Other Ambulatory Visit: Payer: Self-pay | Admitting: Neurology

## 2015-04-22 ENCOUNTER — Ambulatory Visit
Admission: RE | Admit: 2015-04-22 | Discharge: 2015-04-22 | Disposition: A | Discharge status: Home / Self Care | Payer: Medicaid Other | Source: Ambulatory Visit | Attending: Neurology | Admitting: Neurology

## 2015-04-22 ENCOUNTER — Encounter (INDEPENDENT_AMBULATORY_CARE_PROVIDER_SITE_OTHER): Payer: Medicaid Other | Admitting: Neurology

## 2015-04-22 DIAGNOSIS — R200 Anesthesia of skin: Secondary | ICD-10-CM

## 2015-04-22 DIAGNOSIS — H8110 Benign paroxysmal vertigo, unspecified ear: Secondary | ICD-10-CM

## 2015-04-22 DIAGNOSIS — R42 Dizziness and giddiness: Secondary | ICD-10-CM

## 2015-04-22 DIAGNOSIS — I7774 Dissection of vertebral artery: Secondary | ICD-10-CM

## 2015-04-22 DIAGNOSIS — G8190 Hemiplegia, unspecified affecting unspecified side: Secondary | ICD-10-CM

## 2015-04-22 DIAGNOSIS — I6389 Other cerebral infarction: Secondary | ICD-10-CM

## 2015-04-22 DIAGNOSIS — I639 Cerebral infarction, unspecified: Secondary | ICD-10-CM

## 2015-04-22 MED ORDER — GADOBENATE DIMEGLUMINE 529 MG/ML IV SOLN
12.0000 mL | Freq: Once | INTRAVENOUS | Status: AC | PRN
  Administered 2015-04-22: 12 mL via INTRAVENOUS

## 2015-04-23 ENCOUNTER — Telehealth: Payer: Self-pay | Admitting: Neurology

## 2015-04-23 NOTE — Telephone Encounter (Signed)
Pt called requesting MRI results. Please call and advise. Patient can be reached at [phone].
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
## 2015-04-23 NOTE — Telephone Encounter (Signed)
Thank yoU!! Katrina, can you get her a new appoitnment with Dr. Roda Shutters please? Just a one-time consultation. No hurry.

## 2015-04-23 NOTE — Telephone Encounter (Signed)
Called pt back to let her know results are not ready yet. Dr Lucia Gaskins still needs to review them. Pt verbalized understanding.

## 2015-04-23 NOTE — Telephone Encounter (Signed)
Absolutely. Happy to do so.   Marvel Plan, MD PhD Stroke Neurology 04/23/2015 5:54 PM

## 2015-04-24 ENCOUNTER — Telehealth: Payer: Self-pay

## 2015-04-24 NOTE — Telephone Encounter (Signed)
Rn call patient to schedule one time consultation appt with a stroke md per her request. Dr Lucia GaskinsAhern talk to Dr. Roda ShuttersXu and he will see the patient. Nurse offer the patient a earlier appt in November, she stated "Im only available on Mon, Wed, Fridays, and I can only come certain times. Appt set for 05-31-15 at 1030, Rn told her to arrive 15 min early.
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
## 2015-05-01 NOTE — Telephone Encounter (Signed)
Called pt back. Returning her call. Gave [facility] phone number and office hours.

## 2015-05-01 NOTE — Telephone Encounter (Signed)
Patient is calling to discuss MRI results. She has more questions. The patient states she is also having a lot of dizziness. Please call.
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
## 2015-05-31 ENCOUNTER — Encounter: Payer: Self-pay | Admitting: Neurology

## 2015-05-31 ENCOUNTER — Ambulatory Visit (INDEPENDENT_AMBULATORY_CARE_PROVIDER_SITE_OTHER): Payer: Medicaid Other | Admitting: Neurology

## 2015-05-31 VITALS — BP 122/92 | HR 95 | Ht 64.0 in | Wt 135.2 lb

## 2015-05-31 DIAGNOSIS — F4323 Adjustment disorder with mixed anxiety and depressed mood: Secondary | ICD-10-CM | POA: Diagnosis not present

## 2015-05-31 DIAGNOSIS — F4310 Post-traumatic stress disorder, unspecified: Secondary | ICD-10-CM

## 2015-05-31 DIAGNOSIS — R208 Other disturbances of skin sensation: Secondary | ICD-10-CM

## 2015-05-31 DIAGNOSIS — G8190 Hemiplegia, unspecified affecting unspecified side: Secondary | ICD-10-CM | POA: Diagnosis not present
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
## 2015-09-18 ENCOUNTER — Telehealth: Payer: Self-pay | Admitting: Neurology

## 2015-09-18 NOTE — Telephone Encounter (Signed)
Called pt back. Ok per Dr Lucia GaskinsAhern to be scheduled with NP. Gave available appt for both Jessica Randolph and Jessica Randolph. Pt requested 3/9 at 130pm w/ Jessica Angelarolyn Martin, NP. Told her to check in at 115pm. Pt verbalized understanding.

## 2015-09-18 NOTE — Telephone Encounter (Signed)
Called pt back. She is still having dizziness and blurry vision d/t dizziness per pt. She states she is not "sick". She felt this way over the weekend and stated "I should have called Monday, I didn't know I would have to wait until next to see Dr. Lucia GaskinsAhern". Advised I will speak to Dr Lucia GaskinsAhern, she is seeing pt right now to see if she can see a NP and I will call her back to advise. She verbalized understanding.

## 2015-09-18 NOTE — Telephone Encounter (Signed)
Pt called complaining she has been sick and dizzy all weekend and it has not changed she is wondering about coming in earlier thant March 16th. Please call and advise [phone]

## 2015-09-18 NOTE — Telephone Encounter (Signed)
Dr Elesa MassedAhern-ok for her to see NP? See phone note.

## 2015-09-19 ENCOUNTER — Telehealth: Payer: Self-pay | Admitting: *Deleted

## 2015-09-19 ENCOUNTER — Ambulatory Visit: Payer: Self-pay | Admitting: Nurse Practitioner

## 2015-09-19 NOTE — Telephone Encounter (Signed)
No show - called morning of appt to cancel - sick.

## 2015-09-26 ENCOUNTER — Ambulatory Visit: Payer: Medicaid Other | Admitting: Neurology

## 2015-11-12 ENCOUNTER — Telehealth: Payer: Self-pay | Admitting: Neurology

## 2015-11-12 ENCOUNTER — Ambulatory Visit (INDEPENDENT_AMBULATORY_CARE_PROVIDER_SITE_OTHER): Payer: Medicaid Other | Admitting: Nurse Practitioner

## 2015-11-12 ENCOUNTER — Encounter: Payer: Self-pay | Admitting: Nurse Practitioner

## 2015-11-12 VITALS — BP 122/89 | HR 103 | Ht 64.0 in | Wt 134.4 lb

## 2015-11-12 DIAGNOSIS — R208 Other disturbances of skin sensation: Secondary | ICD-10-CM

## 2015-11-12 DIAGNOSIS — G8190 Hemiplegia, unspecified affecting unspecified side: Secondary | ICD-10-CM | POA: Diagnosis not present

## 2015-11-12 DIAGNOSIS — M542 Cervicalgia: Secondary | ICD-10-CM | POA: Diagnosis not present

## 2015-11-12 DIAGNOSIS — R202 Paresthesia of skin: Secondary | ICD-10-CM | POA: Diagnosis not present

## 2015-11-12 DIAGNOSIS — R200 Anesthesia of skin: Secondary | ICD-10-CM
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
## 2015-11-12 NOTE — Patient Instructions (Signed)
Will set up for EMG /Nerve conduction studies F/U with Dr. Lucia GaskinsAhern
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
## 2015-11-14 NOTE — Telephone Encounter (Signed)
I mailed the patient a copy of the billing records.. Thanks Angie

## 2015-11-14 NOTE — Telephone Encounter (Signed)
Dr Lucia GaskinsAhern- Warnell ForesterFYI Debra- wanted to make sure you received this request. If not, can you f/u with the pt?   Angie- can you f/u about pt requesting billing records? Not sure if we can provide this to the pt. Thank you!
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
## 2015-11-21 ENCOUNTER — Telehealth: Payer: Self-pay | Admitting: *Deleted

## 2015-11-21 NOTE — Telephone Encounter (Signed)
I called pt left voice message for pt to call the office. Pt need to sign a release form.

## 2015-11-21 NOTE — Telephone Encounter (Signed)
I called pt, left voice mail no answer.

## 2015-12-18 ENCOUNTER — Telehealth: Payer: Self-pay | Admitting: Neurology

## 2015-12-18 NOTE — Telephone Encounter (Signed)
Medical records faxed.

## 2015-12-24 ENCOUNTER — Ambulatory Visit (INDEPENDENT_AMBULATORY_CARE_PROVIDER_SITE_OTHER): Payer: Self-pay | Admitting: Neurology

## 2015-12-24 DIAGNOSIS — R202 Paresthesia of skin: Secondary | ICD-10-CM

## 2015-12-24 DIAGNOSIS — Z0289 Encounter for other administrative examinations: Secondary | ICD-10-CM

## 2015-12-24 DIAGNOSIS — M542 Cervicalgia: Secondary | ICD-10-CM

## 2015-12-24 NOTE — Progress Notes (Signed)
Patient left, could not tolerate the exam

## 2015-12-26 NOTE — Progress Notes (Signed)
Patient let

## 2016-04-30 ENCOUNTER — Other Ambulatory Visit: Payer: Self-pay | Admitting: Physician Assistant

## 2016-04-30 DIAGNOSIS — M542 Cervicalgia: Secondary | ICD-10-CM

## 2016-05-05 ENCOUNTER — Other Ambulatory Visit: Payer: Self-pay | Admitting: Orthopedic Surgery

## 2016-05-05 DIAGNOSIS — M542 Cervicalgia: Secondary | ICD-10-CM

## 2016-05-15 ENCOUNTER — Other Ambulatory Visit: Payer: Medicaid Other

## 2016-05-18 ENCOUNTER — Inpatient Hospital Stay: Admission: RE | Admit: 2016-05-18 | Payer: Medicaid Other | Source: Ambulatory Visit

## 2016-05-29 ENCOUNTER — Ambulatory Visit
Admission: RE | Admit: 2016-05-29 | Discharge: 2016-05-29 | Disposition: A | Discharge status: Home / Self Care | Payer: Medicaid Other | Source: Ambulatory Visit | Attending: Orthopedic Surgery | Admitting: Orthopedic Surgery

## 2016-05-29 DIAGNOSIS — M542 Cervicalgia: Secondary | ICD-10-CM

## 2016-07-15 ENCOUNTER — Emergency Department (HOSPITAL_BASED_OUTPATIENT_CLINIC_OR_DEPARTMENT_OTHER)
Admission: EM | Admit: 2016-07-15 | Discharge: 2016-07-15 | Disposition: A | Discharge status: Home / Self Care | Payer: Medicaid Other | Attending: Emergency Medicine | Admitting: Emergency Medicine

## 2016-07-15 ENCOUNTER — Emergency Department (HOSPITAL_BASED_OUTPATIENT_CLINIC_OR_DEPARTMENT_OTHER): Payer: Medicaid Other

## 2016-07-15 ENCOUNTER — Encounter (HOSPITAL_BASED_OUTPATIENT_CLINIC_OR_DEPARTMENT_OTHER): Payer: Self-pay | Admitting: Emergency Medicine

## 2016-07-15 DIAGNOSIS — R51 Headache: Secondary | ICD-10-CM | POA: Diagnosis present

## 2016-07-15 DIAGNOSIS — J0110 Acute frontal sinusitis, unspecified: Secondary | ICD-10-CM

## 2016-07-15 DIAGNOSIS — R0789 Other chest pain: Secondary | ICD-10-CM | POA: Diagnosis not present

## 2016-07-15 DIAGNOSIS — Z7982 Long term (current) use of aspirin: Secondary | ICD-10-CM | POA: Diagnosis not present

## 2016-07-15 DIAGNOSIS — J45909 Unspecified asthma, uncomplicated: Secondary | ICD-10-CM | POA: Insufficient documentation

## 2016-07-15 DIAGNOSIS — Z79899 Other long term (current) drug therapy: Secondary | ICD-10-CM | POA: Diagnosis not present

## 2016-07-15 HISTORY — DX: Cerebral infarction, unspecified: I63.9

## 2016-07-15 LAB — D-DIMER, QUANTITATIVE: D-Dimer, Quant: <0.27 ug/mL-FEU (ref 0.00–0.50)

## 2016-07-15 LAB — CBC
HEMATOCRIT: 42.9 % (ref 36.0–46.0)
HEMOGLOBIN: 13.7 g/dL (ref 12.0–15.0)
MCH: 29.3 pg (ref 26.0–34.0)
MCHC: 31.9 g/dL (ref 30.0–36.0)
MCV: 91.9 fL (ref 78.0–100.0)
Platelets: 284 10*3/uL (ref 150–400)
RBC: 4.67 MIL/uL (ref 3.87–5.11)
RDW: 13.1 % (ref 11.5–15.5)
WBC: 7.4 10*3/uL (ref 4.0–10.5)

## 2016-07-15 LAB — HEPATIC FUNCTION PANEL
ALT: 11 U/L — AB (ref 14–54)
AST: 16 U/L (ref 15–41)
Albumin: 4.6 g/dL (ref 3.5–5.0)
Alkaline Phosphatase: 71 U/L (ref 38–126)
Bilirubin, Direct: <0.1 mg/dL — ABNORMAL LOW (ref 0.1–0.5)
TOTAL PROTEIN: 7.2 g/dL (ref 6.5–8.1)
Total Bilirubin: 0.5 mg/dL (ref 0.3–1.2)

## 2016-07-15 LAB — BASIC METABOLIC PANEL
ANION GAP: 8 (ref 5–15)
BUN: 16 mg/dL (ref 6–20)
CHLORIDE: 104 mmol/L (ref 101–111)
CO2: 28 mmol/L (ref 22–32)
Calcium: 10.5 mg/dL — ABNORMAL HIGH (ref 8.9–10.3)
Creatinine, Ser: 0.92 mg/dL (ref 0.44–1.00)
GFR calc Af Amer: >60 mL/min (ref >60)
GLUCOSE: 104 mg/dL — AB (ref 65–99)
POTASSIUM: 3.6 mmol/L (ref 3.5–5.1)
Sodium: 140 mmol/L (ref 135–145)

## 2016-07-15 LAB — TROPONIN I: Troponin I: <0.03 ng/mL (ref <0.03)

## 2016-07-15 MED ORDER — AMOXICILLIN-POT CLAVULANATE 875-125 MG PO TABS: 1.0000 | ORAL_TABLET | Freq: Two times a day (BID) | ORAL | 0 refills | Status: DC

## 2016-07-15 MED ORDER — SODIUM CHLORIDE 0.9 % IV BOLUS (SEPSIS)
500.0000 mL | Freq: Once | INTRAVENOUS | Status: AC
  Administered 2016-07-15: 500 mL via INTRAVENOUS

## 2016-07-15 MED ORDER — MECLIZINE HCL 25 MG PO TABS: 25.0000 mg | ORAL_TABLET | Freq: Three times a day (TID) | ORAL | 0 refills | Status: DC | PRN

## 2016-07-15 MED ORDER — IBUPROFEN 200 MG PO TABS
600.0000 mg | ORAL_TABLET | Freq: Once | ORAL | Status: AC
  Administered 2016-07-15: 600 mg via ORAL
  Filled 2016-07-15: qty 1

## 2016-07-15 MED FILL — MECLIZINE 25 MG TABLET: 25 | 4 days supply | Qty: 10 | Fill #0

## 2016-07-15 MED FILL — AMOX-CLAV 875-125 MG TABLET: 875-125 | 7 days supply | Qty: 14 | Fill #0

## 2016-07-15 NOTE — ED Provider Notes (Signed)
[facility] Provider Note   CSN: [id number] Arrival date & time: 07/15/16  1329     History   Chief Complaint Chief Complaint  Patient presents with  . Chest Pain  . Dizziness    HPI Jessica Randolph is a 61 y.o. female.  The history is provided by the patient. No language interpreter was used.   Jessica Randolph is a 61 y.o. female who presents to the Emergency Department complaining of multiple complaints.  She complains of multiple symptoms that began on Friday. She reports right-sided dull headache with associated intermittent dizziness that feels like she is off balance. The headache has resolved but her dizziness persists. She also has associated right sided lower chest wall pain that radiates to her back and arm.Chest pain is worse with swallowing and meals. She has associated shortness of breath with a nonproductive cough. No fevers, vomiting, abdominal pain, leg swelling or pain.  Past Medical History:  Diagnosis Date  . CFS (chronic fatigue syndrome)   . Colitis   . Dizzy spells   . Fibromyalgia   . GERD (gastroesophageal reflux disease)   . Stroke [facility](HCC)     Patient Active Problem List   Diagnosis Date Noted  . Neck pain 11/12/2015  . Adjustment disorder with mixed anxiety and depressed mood 05/31/2015  . PTSD (post-traumatic stress disorder) 05/31/2015  . Right sided weakness 04/07/2015  . Paresthesia 04/07/2015  . Hemiparesis (HCC) 04/03/2015  . Sensory loss 04/03/2015  . Hemisensory loss 04/03/2015  . HYPERLIPIDEMIA 02/05/2010  . PLANTAR FASCIITIS, BILATERAL 02/05/2010  . HOT FLASHES 12/20/2009  . ALLERGIC RHINITIS 10/14/2009  . ASTHMA 10/14/2009  . POSTMENOPAUSAL SYNDROME 10/14/2009  . OSTEOARTHRITIS 10/14/2009  . FIBROMYALGIA 10/14/2009  . CHRONIC FATIGUE SYNDROME 10/14/2009    Past Surgical History:  Procedure Laterality Date  . CHOLECYSTECTOMY  1985  . TONSILLECTOMY      OB History    No data available       Home Medications     Prior to Admission medications   Medication Sig Start Date End Date Taking? Authorizing Provider  acetaminophen (TYLENOL) 325 MG tablet Take 650 mg by mouth every 6 (six) hours as needed.    Historical Provider, MD  amoxicillin-clavulanate (AUGMENTIN) 875-125 MG tablet Take 1 tablet by mouth every 12 (twelve) hours. 07/15/16   Tilden FossaElizabeth Valente Fosberg, MD  aspirin 81 MG tablet Take 81 mg by mouth daily.    Historical Provider, MD  cyclobenzaprine (FLEXERIL) 10 MG tablet Take 10 mg by mouth 3 (three) times daily as needed for muscle spasms.    Historical Provider, MD  HYDROcodone-acetaminophen (NORCO/VICODIN) 5-325 MG per tablet Take 1 tablet by mouth at bedtime. Reported on 11/12/2015    Historical Provider, MD  ibuprofen (ADVIL,MOTRIN) 200 MG tablet Take 400 mg by mouth every 6 (six) hours as needed. For pain.    Historical Provider, MD  meclizine (ANTIVERT) 25 MG tablet Take 1 tablet (25 mg total) by mouth 3 (three) times daily as needed for dizziness. 07/15/16   Tilden FossaElizabeth Kena Limon, MD  montelukast (SINGULAIR) 10 MG tablet Take 10 mg by mouth at bedtime.    Historical Provider, MD  Multiple Vitamin (MULTIVITAMIN) tablet Take 1 tablet by mouth daily.    Historical Provider, MD  omeprazole (PRILOSEC) 10 MG capsule Take 20 mg by mouth daily.    Historical Provider, MD    Family History Family History  Problem Relation Age of Onset  . Dementia Mother   . Congestive Heart Failure  Mother   . Stroke Mother   . Diabetes Father   . Stroke Father   . Heart attack Father     Social History Social History  Substance Use Topics  . Smoking status: Never Smoker  . Smokeless tobacco: Not on file  . Alcohol use No     Allergies   Erythromycin and Oxycodone   Review of Systems Review of Systems  All other systems reviewed and are negative.    Physical Exam Updated Vital Signs BP 131/88   Pulse 82   Temp 97.7 F (36.5 C) (Oral)   Resp 19   Ht 5\' 3"  (1.6 m)   Wt 140 lb (63.5 kg)   SpO2 100%   BMI  24.80 kg/m   Physical Exam  Constitutional: She is oriented to person, place, and time. She appears well-developed and well-nourished.  HENT:  Head: Normocephalic and atraumatic.  Right Ear: External ear normal.  Left Ear: External ear normal.  Mouth/Throat: Oropharynx is clear and moist.  Eyes: EOM are normal. Pupils are equal, round, and reactive to light.  Neck:  No carotid bruits  Cardiovascular: Normal rate and regular rhythm.   No murmur heard. Pulmonary/Chest: Effort normal and breath sounds normal. No respiratory distress.  Abdominal: Soft. There is no tenderness. There is no rebound and no guarding.  Musculoskeletal: She exhibits no edema or tenderness.  Neurological: She is alert and oriented to person, place, and time.  Skin: Skin is warm and dry.  Psychiatric: She has a normal mood and affect. Her behavior is normal.  Nursing note and vitals reviewed.    ED Treatments / Results  Labs (all labs ordered are listed, but only abnormal results are displayed) Labs Reviewed  BASIC METABOLIC PANEL - Abnormal; Notable for the following:       Result Value   Glucose, Bld 104 (*)    Calcium 10.5 (*)    All other components within normal limits  HEPATIC FUNCTION PANEL - Abnormal; Notable for the following:    ALT 11 (*)    Bilirubin, Direct <0.1 (*)    All other components within normal limits  CBC  TROPONIN I  D-DIMER, QUANTITATIVE (NOT AT [facility])    EKG  EKG Interpretation  Date/Time:  Wednesday July 15 2016 13:38:45 EST Ventricular Rate:  99 PR Interval:  184 QRS Duration: 76 QT Interval:  344 QTC Calculation: 441 R Axis:   62 Text Interpretation:  Normal sinus rhythm Normal ECG Confirmed by Lincoln Brigham [phone]) on 07/15/2016 1:38:45 PM       Radiology Dg Chest 2 View  Result Date: 07/15/2016 CLINICAL DATA:  Chest pain and dizziness for 5 days EXAM: CHEST  2 VIEW COMPARISON:  April 14, 2013 FINDINGS: There is no edema or consolidation. The heart size and  pulmonary vascularity are normal. No adenopathy. There are surgical clips in the right upper quadrant. No bone lesions. No pneumothorax. IMPRESSION: No edema or consolidation. Electronically Signed   By: Bretta Bang III M.D.   On: 07/15/2016 14:51    Procedures Procedures (including critical care time)  Medications Ordered in ED Medications  sodium chloride 0.9 % bolus 500 mL (0 mLs Intravenous Stopped 07/15/16 1545)  ibuprofen (ADVIL,MOTRIN) tablet 600 mg (600 mg Oral Given 07/15/16 1541)     Initial Impression / Assessment and Plan / ED Course  I have reviewed the triage vital signs and the nursing notes.  Pertinent labs & imaging results that were available during my care of  the patient were reviewed by me and considered in my medical decision making (see chart for details).  Clinical Course   Patient here for evaluation of dizziness, headache, chest pain. Presentation is not consistent with subarachnoid hemorrhage, meningitis, CVA, dissection, PE, ACS, pneumonia. In terms of dizziness and headaches, question element of sinusitis given the history of frontal sinusitis in the past, will treat with Augmentin, meclizine, decongestants. In terms of chest pain, likely secondary to her stopping her Prilosec, recommend reinitiating this medication. Outpatient follow-up, home care and return precautions were discussed.  Final Clinical Impressions(s) / ED Diagnoses   Final diagnoses:  Acute frontal sinusitis, recurrence not specified  Atypical chest pain    New Prescriptions Discharge Medication List as of 07/15/2016  3:32 PM    START taking these medications   Details  amoxicillin-clavulanate (AUGMENTIN) 875-125 MG tablet Take 1 tablet by mouth every 12 (twelve) hours., Starting Wed 07/15/2016, Print    meclizine (ANTIVERT) 25 MG tablet Take 1 tablet (25 mg total) by mouth 3 (three) times daily as needed for dizziness., Starting Wed 07/15/2016, Print         Tilden Fossa,  MD 07/15/16 [phone]
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
## 2016-12-18 ENCOUNTER — Emergency Department (HOSPITAL_COMMUNITY)
Admission: EM | Admit: 2016-12-18 | Discharge: 2016-12-18 | Disposition: A | Discharge status: Home / Self Care | Payer: Medicaid Other | Attending: Emergency Medicine | Admitting: Emergency Medicine

## 2016-12-18 ENCOUNTER — Encounter (HOSPITAL_COMMUNITY): Payer: Self-pay | Admitting: *Deleted

## 2016-12-18 DIAGNOSIS — S00251A Superficial foreign body of right eyelid and periocular area, initial encounter: Secondary | ICD-10-CM | POA: Diagnosis not present

## 2016-12-18 DIAGNOSIS — Y929 Unspecified place or not applicable: Secondary | ICD-10-CM | POA: Insufficient documentation

## 2016-12-18 DIAGNOSIS — Z7982 Long term (current) use of aspirin: Secondary | ICD-10-CM | POA: Diagnosis not present

## 2016-12-18 DIAGNOSIS — Y939 Activity, unspecified: Secondary | ICD-10-CM | POA: Diagnosis not present

## 2016-12-18 DIAGNOSIS — X838XXA Intentional self-harm by other specified means, initial encounter: Secondary | ICD-10-CM | POA: Diagnosis not present

## 2016-12-18 DIAGNOSIS — H5711 Ocular pain, right eye: Secondary | ICD-10-CM | POA: Diagnosis present

## 2016-12-18 DIAGNOSIS — Y999 Unspecified external cause status: Secondary | ICD-10-CM | POA: Insufficient documentation

## 2016-12-18 DIAGNOSIS — J45909 Unspecified asthma, uncomplicated: Secondary | ICD-10-CM | POA: Diagnosis not present

## 2016-12-18 DIAGNOSIS — T1591XA Foreign body on external eye, part unspecified, right eye, initial encounter: Secondary | ICD-10-CM

## 2016-12-18 DIAGNOSIS — Z79899 Other long term (current) drug therapy: Secondary | ICD-10-CM | POA: Insufficient documentation

## 2016-12-18 MED ORDER — TETRACAINE HCL 0.5 % OP SOLN
1.0000 [drp] | Freq: Once | OPHTHALMIC | Status: AC
Start: 2016-12-18 — End: 2016-12-18
  Administered 2016-12-18: 1 [drp] via OPHTHALMIC
  Filled 2016-12-18: qty 4

## 2016-12-18 MED ORDER — FLUORESCEIN SODIUM 0.6 MG OP STRP
1.0000 | ORAL_STRIP | Freq: Once | OPHTHALMIC | Status: AC
Start: 2016-12-18 — End: 2016-12-18
  Administered 2016-12-18: 1 via OPHTHALMIC
  Filled 2016-12-18: qty 1

## 2016-12-18 MED ORDER — BACITRACIN-POLYMYXIN B 500-10000 UNIT/GM OP OINT: 1.0000 "application " | TOPICAL_OINTMENT | Freq: Two times a day (BID) | OPHTHALMIC | 0 refills | Status: DC

## 2016-12-18 NOTE — ED Provider Notes (Signed)
[facility]-EMERGENCY DEPT Provider Note   CSN: [id number] Arrival date & time: 12/18/16  1323  By signing my name below, I, Rosario AdieWilliam Andrew Hiatt, attest that this documentation has been prepared under the direction and in the presence of Khloei Spiker, PA-C.  Electronically Signed: Rosario AdieWilliam Andrew Hiatt, ED Scribe. 12/18/16. 2:02 PM.  History   Chief Complaint Chief Complaint  Patient presents with  . Foreign Body in Eye    Super glue   The history is provided by the patient. No language interpreter was used.   HPI Comments: Jessica Randolph is a 61 y.o. female who presents to the Emergency Department complaining of sudden onset, constant right eye pain beginning this afternoon just prior to arrival after supergluing a brush together, and while putting the two pieces together a small piece of liquid glue squirted into her eye. She notes immediate burning pain upon contact with eye; however, she reports that her pain has improved. She also notes associated blurriness to the eye, sensation of foreign body, increased tearing, and redness to the eye. Pt immediately flushed the eye with water and has been holding a wet rag to the eye which has been improving her symptoms. Prior to this she was otherwise feeling normal and at her baseline. No h/o similar accidents or surgical involvement to the affected eye. Pt does wear corrective contact, but she was not wearing them at the time of this incident. She denies fever, abnormal eye drainage, or any other associated symptoms.   Past Medical History:  Diagnosis Date  . CFS (chronic fatigue syndrome)   . Colitis   . Dizzy spells   . Fibromyalgia   . GERD (gastroesophageal reflux disease)   . Stroke [facility](HCC)    Patient Active Problem List   Diagnosis Date Noted  . Neck pain 11/12/2015  . Adjustment disorder with mixed anxiety and depressed mood 05/31/2015  . PTSD (post-traumatic stress disorder) 05/31/2015  . Right sided weakness 04/07/2015  . Paresthesia  04/07/2015  . Hemiparesis (HCC) 04/03/2015  . Sensory loss 04/03/2015  . Hemisensory loss 04/03/2015  . HYPERLIPIDEMIA 02/05/2010  . PLANTAR FASCIITIS, BILATERAL 02/05/2010  . HOT FLASHES 12/20/2009  . ALLERGIC RHINITIS 10/14/2009  . ASTHMA 10/14/2009  . POSTMENOPAUSAL SYNDROME 10/14/2009  . OSTEOARTHRITIS 10/14/2009  . FIBROMYALGIA 10/14/2009  . CHRONIC FATIGUE SYNDROME 10/14/2009   Past Surgical History:  Procedure Laterality Date  . CHOLECYSTECTOMY  1985  . TONSILLECTOMY     OB History    No data available     Home Medications    Prior to Admission medications   Medication Sig Start Date End Date Taking? Authorizing Provider  acetaminophen (TYLENOL) 325 MG tablet Take 650 mg by mouth every 6 (six) hours as needed.    [provider]  amoxicillin-clavulanate (AUGMENTIN) 875-125 MG tablet Take 1 tablet by mouth every 12 (twelve) hours. 07/15/16   Tilden Fossaees, Elizabeth, MD  aspirin 81 MG tablet Take 81 mg by mouth daily.    [provider]  bacitracin-polymyxin b (POLYSPORIN) ophthalmic ointment Place 1 application into the right eye every 12 (twelve) hours. apply to eye every 12 hours while awake 12/18/16   Whitman Meinhardt, PA-C  cyclobenzaprine (FLEXERIL) 10 MG tablet Take 10 mg by mouth 3 (three) times daily as needed for muscle spasms.    [provider]  HYDROcodone-acetaminophen (NORCO/VICODIN) 5-325 MG per tablet Take 1 tablet by mouth at bedtime. Reported on 11/12/2015    [provider]  ibuprofen (ADVIL,MOTRIN) 200 MG tablet Take  400 mg by mouth every 6 (six) hours as needed. For pain.    [provider]  meclizine (ANTIVERT) 25 MG tablet Take 1 tablet (25 mg total) by mouth 3 (three) times daily as needed for dizziness. 07/15/16   Tilden Fossa, MD  montelukast (SINGULAIR) 10 MG tablet Take 10 mg by mouth at bedtime.    [provider]  Multiple Vitamin (MULTIVITAMIN) tablet Take 1 tablet by mouth daily.    [provider]   omeprazole (PRILOSEC) 10 MG capsule Take 20 mg by mouth daily.    [provider]   Family History Family History  Problem Relation Age of Onset  . Dementia Mother   . Congestive Heart Failure Mother   . Stroke Mother   . Diabetes Father   . Stroke Father   . Heart attack Father    Social History Social History  Substance Use Topics  . Smoking status: Never Smoker  . Smokeless tobacco: Never Used  . Alcohol use No   Allergies   Erythromycin and Oxycodone  Review of Systems Review of Systems  Constitutional: Negative for fever.  HENT: Negative for ear pain and facial swelling.   Eyes: Positive for pain, redness and visual disturbance. Negative for discharge and itching.  Gastrointestinal: Negative for nausea and vomiting.   Physical Exam Updated Vital Signs BP (!) 142/95 (BP Location: Left Arm)   Pulse 85   Temp 98.2 F (36.8 C) (Oral)   Resp 18   SpO2 97%   Physical Exam  Constitutional: She appears well-developed and well-nourished. No distress.  HENT:  Head: Normocephalic and atraumatic.  Eyes: EOM are normal. Pupils are equal, round, and reactive to light. Lids are everted and swept, no foreign bodies found. No foreign body present in the right eye. No foreign body present in the left eye. Right conjunctiva is injected. Right conjunctiva has no hemorrhage. Left conjunctiva has no hemorrhage. No scleral icterus. Right eye exhibits normal extraocular motion. Left eye exhibits normal extraocular motion.  No dried glue spots noted on cornea. PH of eye is 7 before irrigation. Fluorescein stain revealed no corneal abrasions.  Neck: Normal range of motion.  Pulmonary/Chest: Effort normal. No respiratory distress.  Neurological: She is alert.  Skin: No rash noted. She is not diaphoretic.  Psychiatric: She has a normal mood and affect.  Nursing note and vitals reviewed.  ED Treatments / Results  DIAGNOSTIC STUDIES: Oxygen Saturation is 97% on RA, normal by  my interpretation.   COORDINATION OF CARE: 2:02 PM-Discussed next steps with pt. Pt verbalized understanding and is agreeable with the plan.   Labs (all labs ordered are listed, but only abnormal results are displayed) Labs Reviewed - No data to display  EKG  EKG Interpretation None      Radiology No results found.  Procedures Procedures  Medications Ordered in ED Medications  fluorescein ophthalmic strip 1 strip (1 strip Right Eye Given by Other 12/18/16 1530)  tetracaine (PONTOCAINE) 0.5 % ophthalmic solution 1 drop (1 drop Right Eye Given by Other 12/18/16 1530)    Initial Impression / Assessment and Plan / ED Course  I have reviewed the triage vital signs and the nursing notes.  Pertinent labs & imaging results that were available during my care of the patient were reviewed by me and considered in my medical decision making (see chart for details).     Patient presents to the ED for superglue in right eye PTA. She states that she has been  experiencing blurry vision since it occurred. She immediately flushed the area after she was exposed to the superglue in her eye. PH of eye was 7 prior to irrigation here in the ED. Conjunctiva is injected but patient appears to have normal EOMs. Visual acuity with glasses on was 20/50 in the right eye, 20/20 in left eye, 20/30 in both eyes. Spoke to poison control who states that if there are visible dried glue spots on the eye, they recommend that we use sterile ophthalmic ointment or sterile mineral oil to break up the adhesive. They also advise patching the eye for 3 days with dry gauze if there are dry glue spots present. Due to the nature of the ingredients in superglue, poison control states that it is not toxic to the body. Area was irrigated with Lequita Halt lens and lactated Ringer's solution. Nurse states there were small pieces of debris that seemed to dislodge during irrigation.  0510: Visual acuity is assessed after irrigation and  remained the same. Fluorescein stain negative for abrasions. Spoke to ophthalmology who states that patient can be seen in clinic if she continues to have blurry vision. Recommended antibiotic eye ointment for her to use. Patient is agreeable to above plan. She appears stable for discharge at this time. Recommended no contact lenses until symptoms resolve. Strict return precautions given.  Patient informed of the above consults. She reports  Final Clinical Impressions(s) / ED Diagnoses   Final diagnoses:  Pain of right eye  Foreign body of right eye, initial encounter   New Prescriptions Discharge Medication List as of 12/18/2016  4:03 PM    START taking these medications   Details  bacitracin-polymyxin b (POLYSPORIN) ophthalmic ointment Place 1 application into the right eye every 12 (twelve) hours. apply to eye every 12 hours while awake, Starting Fri 12/18/2016, Print       I personally performed the services described in this documentation, which was scribed in my presence. The recorded information has been reviewed and is accurate.     Dietrich Pates, PA-C 12/19/16 1300    Little, Ambrose Finland, MD 12/22/16 2026

## 2016-12-18 NOTE — ED Triage Notes (Signed)
Pt was gluing stuff with liquid superglue and the glue squirted in her right eye.  Pt reports it feels like something is scratching it.

## 2016-12-18 NOTE — ED Notes (Signed)
Pt's eye was flushed 1 L of LR.  Pt did not like the procedure but was able to tolerate the procedure.  Pt did not reports any improvement to her vision.  PA aware.

## 2016-12-18 NOTE — Discharge Instructions (Signed)
Use bacitracin ointment as directed in right eye. Follow-up with eye doctor list below for further evaluation if blurry vision persists. Return to ED for worsening pain, loss of vision, fever, additional injury.

## 2017-01-19 ENCOUNTER — Institutional Professional Consult (permissible substitution): Payer: Medicaid Other | Admitting: Neurology

## 2017-03-16 ENCOUNTER — Institutional Professional Consult (permissible substitution): Payer: Medicaid Other | Admitting: Neurology

## 2017-03-22 ENCOUNTER — Encounter: Payer: Self-pay | Admitting: Neurology

## 2017-03-24 ENCOUNTER — Encounter: Payer: Self-pay | Admitting: Neurology

## 2017-05-10 ENCOUNTER — Institutional Professional Consult (permissible substitution): Payer: Medicaid Other | Admitting: Neurology

## 2018-03-24 ENCOUNTER — Emergency Department (HOSPITAL_BASED_OUTPATIENT_CLINIC_OR_DEPARTMENT_OTHER)
Admission: EM | Admit: 2018-03-24 | Discharge: 2018-03-24 | Disposition: A | Discharge status: Home / Self Care | Payer: Medicaid Other | Attending: Emergency Medicine | Admitting: Emergency Medicine

## 2018-03-24 ENCOUNTER — Other Ambulatory Visit: Payer: Self-pay

## 2018-03-24 ENCOUNTER — Encounter (HOSPITAL_BASED_OUTPATIENT_CLINIC_OR_DEPARTMENT_OTHER): Payer: Self-pay

## 2018-03-24 DIAGNOSIS — J45909 Unspecified asthma, uncomplicated: Secondary | ICD-10-CM | POA: Insufficient documentation

## 2018-03-24 DIAGNOSIS — K625 Hemorrhage of anus and rectum: Secondary | ICD-10-CM | POA: Insufficient documentation

## 2018-03-24 DIAGNOSIS — Z7982 Long term (current) use of aspirin: Secondary | ICD-10-CM | POA: Diagnosis not present

## 2018-03-24 DIAGNOSIS — Z8673 Personal history of transient ischemic attack (TIA), and cerebral infarction without residual deficits: Secondary | ICD-10-CM | POA: Diagnosis not present

## 2018-03-24 DIAGNOSIS — Z79899 Other long term (current) drug therapy: Secondary | ICD-10-CM | POA: Insufficient documentation

## 2018-03-24 DIAGNOSIS — R1030 Lower abdominal pain, unspecified: Secondary | ICD-10-CM | POA: Diagnosis present

## 2018-03-24 LAB — CBC WITH DIFFERENTIAL/PLATELET
Basophils Absolute: 0 10*3/uL (ref 0.0–0.1)
Basophils Relative: 0 %
Eosinophils Absolute: 0 10*3/uL (ref 0.0–0.7)
Eosinophils Relative: 0 %
HEMATOCRIT: 43 % (ref 36.0–46.0)
Hemoglobin: 14 g/dL (ref 12.0–15.0)
LYMPHS ABS: 1.6 10*3/uL (ref 0.7–4.0)
Lymphocytes Relative: 11 %
MCH: 31 pg (ref 26.0–34.0)
MCHC: 32.6 g/dL (ref 30.0–36.0)
MCV: 95.3 fL (ref 78.0–100.0)
MONO ABS: 0.4 10*3/uL (ref 0.1–1.0)
MONOS PCT: 3 %
Neutro Abs: 12.5 10*3/uL — ABNORMAL HIGH (ref 1.7–7.7)
Neutrophils Relative %: 86 %
Platelets: 314 10*3/uL (ref 150–400)
RBC: 4.51 MIL/uL (ref 3.87–5.11)
RDW: 13.4 % (ref 11.5–15.5)
WBC: 14.5 10*3/uL — ABNORMAL HIGH (ref 4.0–10.5)

## 2018-03-24 LAB — URINALYSIS, ROUTINE W REFLEX MICROSCOPIC
Bilirubin Urine: NEGATIVE
GLUCOSE, UA: NEGATIVE mg/dL
Hgb urine dipstick: NEGATIVE
Ketones, ur: NEGATIVE mg/dL
LEUKOCYTES UA: NEGATIVE
Nitrite: NEGATIVE
PROTEIN: NEGATIVE mg/dL
Specific Gravity, Urine: 1.01 (ref 1.005–1.030)
pH: 7.5 (ref 5.0–8.0)

## 2018-03-24 LAB — LIPASE, BLOOD: Lipase: 25 U/L (ref 11–51)

## 2018-03-24 LAB — COMPREHENSIVE METABOLIC PANEL
ALBUMIN: 5 g/dL (ref 3.5–5.0)
ALT: 16 U/L (ref 0–44)
AST: 21 U/L (ref 15–41)
Alkaline Phosphatase: 77 U/L (ref 38–126)
Anion gap: 11 (ref 5–15)
BUN: 17 mg/dL (ref 8–23)
CO2: 26 mmol/L (ref 22–32)
Calcium: 9.9 mg/dL (ref 8.9–10.3)
Chloride: 104 mmol/L (ref 98–111)
Creatinine, Ser: 0.81 mg/dL (ref 0.44–1.00)
GFR calc Af Amer: >60 mL/min (ref >60)
GFR calc non Af Amer: >60 mL/min (ref >60)
Glucose, Bld: 125 mg/dL — ABNORMAL HIGH (ref 70–99)
POTASSIUM: 4 mmol/L (ref 3.5–5.1)
Sodium: 141 mmol/L (ref 135–145)
Total Bilirubin: 0.5 mg/dL (ref 0.3–1.2)
Total Protein: 8.1 g/dL (ref 6.5–8.1)

## 2018-03-24 LAB — TROPONIN I: Troponin I: <0.03 ng/mL (ref <0.03)

## 2018-03-24 MED ORDER — SODIUM CHLORIDE 0.9 % IV SOLN: Freq: Once | INTRAVENOUS | Status: DC

## 2018-03-24 MED ORDER — SODIUM CHLORIDE 0.9 % IV BOLUS
500.0000 mL | Freq: Once | INTRAVENOUS | Status: AC
  Administered 2018-03-24: 500 mL via INTRAVENOUS

## 2018-03-24 MED ORDER — MORPHINE SULFATE (PF) 4 MG/ML IV SOLN
4.0000 mg | Freq: Once | INTRAVENOUS | Status: AC
  Administered 2018-03-24: 4 mg via INTRAVENOUS
  Filled 2018-03-24: qty 1

## 2018-03-24 NOTE — ED Notes (Signed)
ED Provider at bedside. 

## 2018-03-24 NOTE — ED Notes (Signed)
Instructed on NPO, had a large cup of lemonade at bedside .

## 2018-03-24 NOTE — ED Notes (Signed)
Family at bedside. 

## 2018-03-24 NOTE — ED Notes (Signed)
States," I feel better and I can I now, go home?' ED MD informed that was feeling better

## 2018-03-24 NOTE — ED Provider Notes (Signed)
[facility] EMERGENCY DEPARTMENT Provider Note   CSN: [id number] Arrival date & time: 03/24/18  1529     History   Chief Complaint Chief Complaint  Patient presents with  . Abdominal Pain    HPI Jessica Randolph is a 62 y.o. female.  HPI Patient w/ 2 wk hx of ulcerative collitis diagnosed by urgent care.  Claims ~2wks of sever pain when trying to have BM.  Will strain 20-30 minuts and then only dispel blood/mucous per patient.  States pain is 1000/10 when straining, 5/10 otherwise.  She says she has had hot flashes/chills but no objective fever by oral temp at home.  Has been feeling weaker.  No particular food makes the pain better/worse.  Belly pain started generalized epigastic and she now feels it is lower/almost pelvic primary when straining.  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  She denies new vaginal bleeding/discharge/lesions.  She has had a normal appetite during this episode.    She also described intermittent, bilateral chest tightness/pain.  It does not radiate.  It is not exertional but has been getting more frequent for the last 2 weeks.  It last a few minutes at a time with no known aggravating/alleviating.  Sometimes she get SOB when she has the chest pain.   Past Medical History:  Diagnosis Date  . CFS (chronic fatigue syndrome)   . Colitis   . Dizzy spells   . Fibromyalgia   . GERD (gastroesophageal reflux disease)   . Stroke [facility])     Patient Active Problem List   Diagnosis Date Noted  . Neck pain 11/12/2015  . Adjustment disorder with mixed anxiety and depressed mood 05/31/2015  . PTSD (post-traumatic stress disorder) 05/31/2015  . Right sided weakness 04/07/2015  . Paresthesia 04/07/2015  . Hemiparesis (HCC) 04/03/2015  . Sensory loss 04/03/2015  . Hemisensory loss 04/03/2015  . HYPERLIPIDEMIA 02/05/2010  . PLANTAR FASCIITIS, BILATERAL 02/05/2010  . HOT FLASHES 12/20/2009  . ALLERGIC RHINITIS 10/14/2009   . ASTHMA 10/14/2009  . POSTMENOPAUSAL SYNDROME 10/14/2009  . OSTEOARTHRITIS 10/14/2009  . FIBROMYALGIA 10/14/2009  . CHRONIC FATIGUE SYNDROME 10/14/2009    Past Surgical History:  Procedure Laterality Date  . CHOLECYSTECTOMY  1985  . TONSILLECTOMY       OB History   None      Home Medications    Prior to Admission medications   Medication Sig Start Date End Date Taking? Authorizing Provider  acetaminophen (TYLENOL) 325 MG tablet Take 650 mg by mouth every 6 (six) hours as needed.    [provider]  amoxicillin-clavulanate (AUGMENTIN) 875-125 MG tablet Take 1 tablet by mouth every 12 (twelve) hours. 07/15/16   Tilden Fossa, MD  aspirin 81 MG tablet Take 81 mg by mouth daily.    [provider]  bacitracin-polymyxin b (POLYSPORIN) ophthalmic ointment Place 1 application into the right eye every 12 (twelve) hours. apply to eye every 12 hours while awake 12/18/16   Khatri, Hina, PA-C  cyclobenzaprine (FLEXERIL) 10 MG tablet Take 10 mg by mouth 3 (three) times daily as needed for muscle spasms.    [provider]  HYDROcodone-acetaminophen (NORCO/VICODIN) 5-325 MG per tablet Take 1 tablet by mouth at bedtime. Reported on 11/12/2015    [provider]  ibuprofen (ADVIL,MOTRIN) 200 MG tablet Take 400 mg by mouth every 6 (six) hours as needed. For pain.    [provider]  meclizine (ANTIVERT) 25 MG tablet Take 1  tablet (25 mg total) by mouth 3 (three) times daily as needed for dizziness. 07/15/16   Tilden Fossa, MD  montelukast (SINGULAIR) 10 MG tablet Take 10 mg by mouth at bedtime.    [provider]  Multiple Vitamin (MULTIVITAMIN) tablet Take 1 tablet by mouth daily.    [provider]  omeprazole (PRILOSEC) 10 MG capsule Take 20 mg by mouth daily.    [provider]    Family History Family History  Problem Relation Age of Onset  . Dementia Mother   . Congestive Heart Failure Mother   . Stroke Mother   .  Diabetes Father   . Stroke Father   . Heart attack Father     Social History Social History   Tobacco Use  . Smoking status: Never Smoker  . Smokeless tobacco: Never Used  Substance Use Topics  . Alcohol use: No  . Drug use: No     Allergies   Erythromycin and Oxycodone   Review of Systems Review of Systems  Constitutional: Positive for chills, fatigue and fever.       Subjective fever   HENT: Positive for congestion and sore throat.   Respiratory: Positive for chest tightness and shortness of breath. Negative for apnea, cough, choking, wheezing and stridor.   Cardiovascular: Positive for chest pain. Negative for palpitations and leg swelling.  Gastrointestinal: Positive for abdominal pain, anal bleeding and blood in stool. Negative for abdominal distention, constipation, nausea, rectal pain and vomiting.  Endocrine: Positive for polyuria.  Genitourinary: Positive for pelvic pain and urgency. Negative for difficulty urinating and dysuria.  Musculoskeletal: Positive for myalgias.  Skin: Positive for pallor. Negative for rash and wound.  Neurological: Positive for weakness. Negative for dizziness, syncope and light-headedness.     Physical Exam Updated Vital Signs BP (!) 157/99   Pulse 88   Temp 97.7 F (36.5 C) (Oral)   Resp 16   SpO2 98%   Physical Exam  Constitutional: She appears well-developed and well-nourished.  Non-toxic appearance. She appears ill. No distress.  HENT:  Head: Normocephalic.  Eyes: No scleral icterus.  Cardiovascular: Normal rate, regular rhythm and normal heart sounds.  Pulmonary/Chest: Effort normal and breath sounds normal. No stridor. No respiratory distress. She has no wheezes.  Abdominal: Soft. Bowel sounds are normal. She exhibits no distension, no ascites and no mass. There is tenderness in the right lower quadrant, periumbilical area, suprapubic area and left lower quadrant. There is no rigidity, no rebound, no guarding, no  tenderness at McBurney's point and negative Murphy's sign.  Neurological: She is alert.  Skin: Skin is warm and dry. Capillary refill takes more than 3 seconds. No cyanosis. There is pallor.  Psychiatric: She has a normal mood and affect. Her behavior is normal.     ED Treatments / Results  Labs (all labs ordered are listed, but only abnormal results are displayed) Labs Reviewed  CBC WITH DIFFERENTIAL/PLATELET - Abnormal; Notable for the following components:      Result Value   WBC 14.5 (*)    Neutro Abs 12.5 (*)    All other components within normal limits  COMPREHENSIVE METABOLIC PANEL - Abnormal; Notable for the following components:   Glucose, Bld 125 (*)    All other components within normal limits  URINALYSIS, ROUTINE W REFLEX MICROSCOPIC - Abnormal; Notable for the following components:   Color, Urine STRAW (*)    All other components within normal limits  GASTROINTESTINAL PANEL BY PCR, STOOL (REPLACES STOOL CULTURE)  C DIFFICILE QUICK SCREEN W PCR REFLEX  URINE CULTURE  LIPASE, BLOOD  TROPONIN I    EKG EKG Interpretation  Date/Time:  Thursday March 24 2018 16:56:13 EDT Ventricular Rate:  74 PR Interval:    QRS Duration: 83 QT Interval:  372 QTC Calculation: 413 R Axis:   42 Text Interpretation:  Sinus rhythm Abnormal R-wave progression, early transition No significant change since last tracing Confirmed by Melene PlanFloyd, Dan [phone]([id number]) on 03/24/2018 5:01:18 PM   Radiology No results found.  Procedures Procedures (including critical care time)  Medications Ordered in ED Medications  0.9 %  sodium chloride infusion ( Intravenous Hold 03/24/18 1700)  morphine 4 MG/ML injection 4 mg (4 mg Intravenous Given 03/24/18 1658)  sodium chloride 0.9 % bolus 500 mL (0 mLs Intravenous Stopped 03/24/18 1746)     Initial Impression / Assessment and Plan / ED Course  I have reviewed the triage vital signs and the nursing notes.  Pertinent labs & imaging results that were  available during my care of the patient were reviewed by me and considered in my medical decision making (see chart for details).   Potentially dehyfrated vs anemic given pallor and cap refill with story of bleeding per rectum.  Still HTN so shock/sepsis unlikely. Subjective fever symptoms but not febrile could indicate autoimmune as opposed to infectious.  Will draw stool/urine labs, check CBC/lipase/CMP, trop/ecg  5:30pm Trop/ecg/UA neg.  CBC only remarkable for WBC 14.5 and neutrophil 12.5  6pm Patient requested to go home as pain had improved.  We discussed return precautions and need for close f/u with GI.  Stable vitals with stable labs, safe to d/c to close pcp/GI followup     Final Clinical Impressions(s) / ED Diagnoses   Final diagnoses:  Lower abdominal pain  Rectal bleeding    ED Discharge Orders    None       Marthenia RollingBland, Niyanna Asch, DO 03/24/18 1803    Melene PlanFloyd, Dan, DO 03/24/18 1807

## 2018-03-24 NOTE — ED Notes (Signed)
Instructed on needing stool sample, states," That's why I came, because I am constipated and have to have something put up my rectum to make me go"

## 2018-03-24 NOTE — ED Triage Notes (Signed)
Pt c/o ulcerative colitis x 2 weeks-was sen by PCP 2 days ago-started on prednisone, oxycodone, mesalamine supp and was asked to bring in to stool sample-states she is having "just blood and pus coming out"-NAD-steady gait

## 2018-03-24 NOTE — Discharge Instructions (Addendum)
We saw you today for concern of rectal bleeding and abdominal pain.  Your bloodwork did not indicate any anemia but did show a slightly elevated white count which could be caused by the prednisone you started taking.  At this point it does not seem we have any objective diagnosis of the ulcerative colitis you mentioned so we strongly advise you schedule and go to the GI doctor like you were advised to do from the urgent care.  If you develop a significant fever, lightheadedness, or inability to eat/drink then please come back to be evaluated immediately.

## 2018-03-25 ENCOUNTER — Encounter: Payer: Self-pay | Admitting: Internal Medicine

## 2018-03-26 LAB — URINE CULTURE: Culture: NO GROWTH

## 2018-03-29 ENCOUNTER — Encounter (HOSPITAL_COMMUNITY): Payer: Self-pay | Admitting: Emergency Medicine

## 2018-03-29 ENCOUNTER — Other Ambulatory Visit: Payer: Self-pay

## 2018-03-29 ENCOUNTER — Encounter: Payer: Self-pay | Admitting: Internal Medicine

## 2018-03-29 ENCOUNTER — Telehealth: Payer: Self-pay | Admitting: Gastroenterology

## 2018-03-29 ENCOUNTER — Ambulatory Visit: Payer: Medicaid Other | Admitting: Internal Medicine

## 2018-03-29 VITALS — BP 124/82 | HR 81 | Ht 63.0 in | Wt 137.0 lb

## 2018-03-29 DIAGNOSIS — R1030 Lower abdominal pain, unspecified: Secondary | ICD-10-CM

## 2018-03-29 DIAGNOSIS — R194 Change in bowel habit: Secondary | ICD-10-CM | POA: Diagnosis not present

## 2018-03-29 DIAGNOSIS — Z79899 Other long term (current) drug therapy: Secondary | ICD-10-CM | POA: Insufficient documentation

## 2018-03-29 DIAGNOSIS — R1084 Generalized abdominal pain: Secondary | ICD-10-CM | POA: Insufficient documentation

## 2018-03-29 DIAGNOSIS — R935 Abnormal findings on diagnostic imaging of other abdominal regions, including retroperitoneum: Secondary | ICD-10-CM | POA: Diagnosis not present

## 2018-03-29 DIAGNOSIS — Z7982 Long term (current) use of aspirin: Secondary | ICD-10-CM | POA: Insufficient documentation

## 2018-03-29 DIAGNOSIS — Z8673 Personal history of transient ischemic attack (TIA), and cerebral infarction without residual deficits: Secondary | ICD-10-CM | POA: Diagnosis not present

## 2018-03-29 DIAGNOSIS — K625 Hemorrhage of anus and rectum: Secondary | ICD-10-CM

## 2018-03-29 DIAGNOSIS — K219 Gastro-esophageal reflux disease without esophagitis: Secondary | ICD-10-CM

## 2018-03-29 LAB — COMPREHENSIVE METABOLIC PANEL
ALT: 21 U/L (ref 0–44)
AST: 22 U/L (ref 15–41)
Albumin: 4.2 g/dL (ref 3.5–5.0)
Alkaline Phosphatase: 73 U/L (ref 38–126)
Anion gap: 9 (ref 5–15)
BUN: 13 mg/dL (ref 8–23)
CHLORIDE: 104 mmol/L (ref 98–111)
CO2: 29 mmol/L (ref 22–32)
CREATININE: 0.7 mg/dL (ref 0.44–1.00)
Calcium: 9.3 mg/dL (ref 8.9–10.3)
GFR calc non Af Amer: >60 mL/min (ref >60)
Glucose, Bld: 111 mg/dL — ABNORMAL HIGH (ref 70–99)
POTASSIUM: 3.9 mmol/L (ref 3.5–5.1)
SODIUM: 142 mmol/L (ref 135–145)
Total Bilirubin: 0.7 mg/dL (ref 0.3–1.2)
Total Protein: 7.3 g/dL (ref 6.5–8.1)

## 2018-03-29 LAB — CBC
HCT: 44.9 % (ref 36.0–46.0)
Hemoglobin: 14.6 g/dL (ref 12.0–15.0)
MCH: 30.4 pg (ref 26.0–34.0)
MCHC: 32.5 g/dL (ref 30.0–36.0)
MCV: 93.5 fL (ref 78.0–100.0)
PLATELETS: 327 10*3/uL (ref 150–400)
RBC: 4.8 MIL/uL (ref 3.87–5.11)
RDW: 13.1 % (ref 11.5–15.5)
WBC: 11.7 10*3/uL — ABNORMAL HIGH (ref 4.0–10.5)

## 2018-03-29 LAB — LIPASE, BLOOD: LIPASE: 38 U/L (ref 11–51)

## 2018-03-29 NOTE — Progress Notes (Addendum)
HISTORY OF PRESENT ILLNESS:  Jessica Randolph is a 62 y.o. female , reportedly disabled from chronic fatigue syndrome and fibromyalgia, who is sent today by her primary care provider Mr. Jessica Randolph regarding possible colitis. Patient is pleasant, but a suboptimal historian for certain. First, she tells me that she was diagnosed with irritable bowel syndrome by Dr. Cristina Randolph 30 years ago. She tells me that she underwent her first and only colonoscopy at that time. Findings uncertain. She reports long-standing problems with alternating bowel habits and bloating. As well varying degrees of abdominal pain, vaguely described. She tells me that she was diagnosed with ulcerative colitis on a CT scan in 2013. I have obtained that x-ray for review. She was said to have sigmoid and rectal wall thickening suggesting mild distal colitis. No other significant abnormalities. She is status post cholecystectomy. More recently she reports problems with alternating bowel habits, mucus, and rectal bleeding. In addition lower abdominal and pelvic pain. Was seen in the emergency room 03/24/2018. No additional imaging. Blood work remarkable for white blood cell count of 14.5. Normal hemoglobin. She tells me that her PCP placed her on Rowasa enemas and prednisone to be tapered over 9 days, one week ago, for presumed inflammatory bowel disease. She has had days without bowel movements. She does not smoke or use alcohol. She was given oxycodone for pain. GI review of systems is essentially positive. She does report reflux symptoms for which she takes Prilosec. This helps. Other complaints include belching, bloating, chest pain, loss of appetite, nausea, vomiting, fluctuating weight, anal discomfort, tarry stools, and rectal discharge.  REVIEW OF SYSTEMS:  All non-GI ROS negative unless otherwise stated in the history of present illness except for sinus and allergy trouble, anxiety, arthritis, back pain, cough, fatigue, headaches, fevers,  itching, muscle cramps, night sweats, shortness of breath, rash, insomnia, excessive thirst, excessive urination, scratchy throat  Past Medical History:  Diagnosis Date  . CFS (chronic fatigue syndrome)   . Colitis   . Dizzy spells   . Fibromyalgia   . GERD (gastroesophageal reflux disease)   . Stroke [facility])     Past Surgical History:  Procedure Laterality Date  . CHOLECYSTECTOMY  1985  . TONSILLECTOMY      Social History Jessica Randolph  reports that she has never smoked. She has never used smokeless tobacco. She reports that she does not drink alcohol or use drugs.  family history includes Congestive Heart Failure in her mother; Dementia in her mother; Diabetes in her father; Heart attack in her father; Stroke in her father and mother.  Allergies  Allergen Reactions  . Erythromycin     REACTION: stomach upset  . Morphine And Related Itching  . Oxycodone        PHYSICAL EXAMINATION: Vital signs: BP 124/82   Pulse 81   Ht '5\' 3"'$  (1.6 m)   Wt 137 lb (62.1 kg)   BMI 24.27 kg/m   Constitutional: generally well-appearing, no acute distress Psychiatric: alert and oriented x3, cooperative Eyes: extraocular movements intact, anicteric, conjunctiva pink Mouth: oral pharynx moist, no lesions Neck: supple no lymphadenopathy Cardiovascular: heart regular rate and rhythm, no murmur Lungs: clear to auscultation bilaterally Abdomen: soft, nontender, nondistended, no obvious ascites, no peritoneal signs, normal bowel sounds, no organomegaly Rectal:deferred until colonoscopy Extremities: no clubbing, cyanosis, or lower extremity edema bilaterally Skin: no lesions on visible extremities Neuro: No focal deficits. No asterixis.    ASSESSMENT:  #1. Chronic abdominal complaints. Difficult to ascertain by history. Question  of inflammatory bowel disease versus irritable bowel syndrome. #2. GERD. Response to PPI #3. Chronic fatigue syndrome and fibromyalgia #4. Status post  cholecystectomy  PLAN:  #1. Schedule colonoscopy to rule out inflammatory bowel disease. The nature of the procedure, as well as the risks, benefits, and alternatives were carefully and thoroughly reviewed with the patient. Ample time for discussion and questions allowed. The patient understood, was satisfied, and agreed to proceed. #2. Reflux precautions #3. Continue PPI #4. Consider advanced imaging if colonoscopy unremarkable  60 minutes spent face-to-face with the patient. Greater than 50% a time use for counseling regarding her myriad of complaints and workup plan review as well as answering innumerable questions regarding possible treatment options

## 2018-03-29 NOTE — ED Triage Notes (Signed)
Patient c/o abdominal pain x2 weeks. Hx ulcerative colitis. Reports colonoscopy scheduled tomorrow at 0700. States N/V/D since starting cleanse at 1800 tonight. Seen for same at [facility] x5 days ago.

## 2018-03-29 NOTE — Patient Instructions (Signed)

## 2018-03-29 NOTE — Telephone Encounter (Signed)
She called, having significant abdominal pains with the prep.  No nausea or vomiting and her abd isn't distended.  I think this is just more than usual cramping from the prep. She knows that if the pains improve after more diarrhea then she should continue with plans to complete the AM prep as scheduled so that she can have her colonoscopy tomorrow.  If the pains worsen she knows to call back or go to the ER.

## 2018-03-30 ENCOUNTER — Encounter (HOSPITAL_COMMUNITY): Payer: Self-pay

## 2018-03-30 ENCOUNTER — Emergency Department (HOSPITAL_COMMUNITY)
Admission: EM | Admit: 2018-03-30 | Discharge: 2018-03-30 | Disposition: A | Discharge status: Home / Self Care | Payer: Medicaid Other | Attending: Emergency Medicine | Admitting: Emergency Medicine

## 2018-03-30 ENCOUNTER — Emergency Department (HOSPITAL_COMMUNITY): Payer: Medicaid Other

## 2018-03-30 ENCOUNTER — Encounter: Payer: Medicaid Other | Admitting: Internal Medicine

## 2018-03-30 DIAGNOSIS — R1084 Generalized abdominal pain: Secondary | ICD-10-CM

## 2018-03-30 LAB — URINALYSIS, ROUTINE W REFLEX MICROSCOPIC
Bilirubin Urine: NEGATIVE
Glucose, UA: NEGATIVE mg/dL
Hgb urine dipstick: NEGATIVE
Ketones, ur: NEGATIVE mg/dL
Leukocytes, UA: NEGATIVE
NITRITE: NEGATIVE
PROTEIN: NEGATIVE mg/dL
SPECIFIC GRAVITY, URINE: 1.015 (ref 1.005–1.030)
pH: 7 (ref 5.0–8.0)

## 2018-03-30 MED ORDER — IOPAMIDOL (ISOVUE-300) INJECTION 61%
INTRAVENOUS | Status: AC
  Filled 2018-03-30: qty 100

## 2018-03-30 MED ORDER — ONDANSETRON HCL 4 MG/2ML IJ SOLN
4.0000 mg | Freq: Once | INTRAMUSCULAR | Status: AC
  Administered 2018-03-30: 4 mg via INTRAVENOUS
  Filled 2018-03-30: qty 2

## 2018-03-30 MED ORDER — IOPAMIDOL (ISOVUE-300) INJECTION 61%
100.0000 mL | Freq: Once | INTRAVENOUS | Status: AC | PRN
  Administered 2018-03-30: 100 mL via INTRAVENOUS

## 2018-03-30 MED ORDER — FENTANYL CITRATE (PF) 100 MCG/2ML IJ SOLN
100.0000 ug | Freq: Once | INTRAMUSCULAR | Status: AC
  Administered 2018-03-30: 100 ug via INTRAVENOUS
  Filled 2018-03-30: qty 2

## 2018-03-30 NOTE — ED Provider Notes (Signed)
[facility]-EMERGENCY DEPT Provider Note: Jessica Dell, MD, FACEP  CSN: [id number] MRN: [id number] ARRIVAL: 03/29/18 at 2140 ROOM: WA18/WA18   CHIEF COMPLAINT  Abdominal Pain   HISTORY OF PRESENT ILLNESS  03/30/18 1:52 AM Jessica Randolph is a 62 y.o. female with a history of ulcerative colitis who is currently having symptoms suggestive of flare.  She is scheduled to have a colonoscopy this morning at 7 AM.  She initiated her bowel prep yesterday evening drinking the first half of the solution.  She subsequently developed generalized abdominal pain which she describes as severe.  She has been having liquid bowel movements as expected from the prep but states she is unable to complete the prep due to the pain.  She called the gastroenterologist for [facility] yesterday evening about 9 PM and states she was told to go to the ED if symptoms become unmanageable but was given no other advice.  Dr. Christella Hartigan' note confirms this.  She has had minimal associated nausea and vomiting.   Past Medical History:  Diagnosis Date  . CFS (chronic fatigue syndrome)   . Colitis   . Dizzy spells   . Fibromyalgia   . GERD (gastroesophageal reflux disease)   . Stroke [facility])     Past Surgical History:  Procedure Laterality Date  . CHOLECYSTECTOMY  1985  . TONSILLECTOMY      Family History  Problem Relation Age of Onset  . Dementia Mother   . Congestive Heart Failure Mother   . Stroke Mother   . Diabetes Father   . Stroke Father   . Heart attack Father     Social History   Tobacco Use  . Smoking status: Never Smoker  . Smokeless tobacco: Never Used  Substance Use Topics  . Alcohol use: No  . Drug use: No    Prior to Admission medications   Medication Sig Start Date End Date Taking? Authorizing Provider  acetaminophen (TYLENOL) 325 MG tablet Take 650 mg by mouth every 6 (six) hours as needed for mild pain, moderate pain or headache.    Yes [provider]  aspirin 81 MG tablet Take 81 mg  by mouth daily.   Yes [provider]  mesalamine (ROWASA) 4 g enema Place 4 g rectally daily.    Yes [provider]  montelukast (SINGULAIR) 10 MG tablet Take 10 mg by mouth at bedtime.   Yes [provider]  Multiple Vitamin (MULTIVITAMIN) tablet Take 1 tablet by mouth daily.   Yes [provider]  omeprazole (PRILOSEC) 10 MG capsule Take 20 mg by mouth daily.   Yes [provider]  PEG-KCl-NaCl-NaSulf-Na Asc-C (PLENVU) 140 g SOLR Take 1 each by mouth once.   Yes [provider]  predniSONE (STERAPRED UNI-PAK 48 TAB) 10 MG (48) TBPK tablet Take 10 mg by mouth See admin instructions. 03/22/18  Yes [provider]  meclizine (ANTIVERT) 25 MG tablet Take 1 tablet (25 mg total) by mouth 3 (three) times daily as needed for dizziness. Patient not taking: Reported on 03/30/2018 07/15/16   Tilden Fossa, MD    Allergies Erythromycin; Morphine and related; and Oxycodone   REVIEW OF SYSTEMS  Negative except as noted here or in the History of Present Illness.   PHYSICAL EXAMINATION  Initial Vital Signs Blood pressure (!) 152/89, pulse (!) 57, temperature 97.9 F (36.6 C), temperature source Oral, resp. rate 16, height 5\' 3"  (1.6 m), weight 61.2 kg, SpO2 98 %.  Examination General: Well-developed, well-nourished  female in no acute distress; appearance consistent with age of record HENT: normocephalic; atraumatic Eyes: pupils equal, round and reactive to light; extraocular muscles intact Neck: supple Heart: regular rate and rhythm Lungs: clear to auscultation bilaterally Abdomen: soft; nondistended; diffusely tender; no masses or hepatosplenomegaly; bowel sounds hypoactive Extremities: No deformity; full range of motion; pulses normal Neurologic: Awake, alert and oriented; motor function intact in all extremities and symmetric; no facial droop Skin: Warm and dry Psychiatric: Normal mood and affect   RESULTS  Summary of this  visit's results, reviewed by myself:   EKG Interpretation  Date/Time:    Ventricular Rate:    PR Interval:    QRS Duration:   QT Interval:    QTC Calculation:   R Axis:     Text Interpretation:        Laboratory Studies: Results for orders placed or performed during the hospital encounter of 03/30/18 (from the past 24 hour(s))  Lipase, blood     Status: None   Collection Time: 03/29/18 10:31 PM  Result Value Ref Range   Lipase 38 11 - 51 U/L  Comprehensive metabolic panel     Status: Abnormal   Collection Time: 03/29/18 10:31 PM  Result Value Ref Range   Sodium 142 135 - 145 mmol/L   Potassium 3.9 3.5 - 5.1 mmol/L   Chloride 104 98 - 111 mmol/L   CO2 29 22 - 32 mmol/L   Glucose, Bld 111 (H) 70 - 99 mg/dL   BUN 13 8 - 23 mg/dL   Creatinine, Ser 1.61 0.44 - 1.00 mg/dL   Calcium 9.3 8.9 - 09.6 mg/dL   Total Protein 7.3 6.5 - 8.1 g/dL   Albumin 4.2 3.5 - 5.0 g/dL   AST 22 15 - 41 U/L   ALT 21 0 - 44 U/L   Alkaline Phosphatase 73 38 - 126 U/L   Total Bilirubin 0.7 0.3 - 1.2 mg/dL   GFR calc non Af Amer >60 >60 mL/min   GFR calc Af Amer >60 >60 mL/min   Anion gap 9 5 - 15  CBC     Status: Abnormal   Collection Time: 03/29/18 10:31 PM  Result Value Ref Range   WBC 11.7 (H) 4.0 - 10.5 K/uL   RBC 4.80 3.87 - 5.11 MIL/uL   Hemoglobin 14.6 12.0 - 15.0 g/dL   HCT 04.5 40.9 - 81.1 %   MCV 93.5 78.0 - 100.0 fL   MCH 30.4 26.0 - 34.0 pg   MCHC 32.5 30.0 - 36.0 g/dL   RDW 91.4 78.2 - 95.6 %   Platelets 327 150 - 400 K/uL  Urinalysis, Routine w reflex microscopic     Status: None   Collection Time: 03/30/18  1:21 AM  Result Value Ref Range   Color, Urine YELLOW YELLOW   APPearance CLEAR CLEAR   Specific Gravity, Urine 1.015 1.005 - 1.030   pH 7.0 5.0 - 8.0   Glucose, UA NEGATIVE NEGATIVE mg/dL   Hgb urine dipstick NEGATIVE NEGATIVE   Bilirubin Urine NEGATIVE NEGATIVE   Ketones, ur NEGATIVE NEGATIVE mg/dL   Protein, ur NEGATIVE NEGATIVE mg/dL   Nitrite NEGATIVE NEGATIVE    Leukocytes, UA NEGATIVE NEGATIVE   Imaging Studies: Ct Abdomen Pelvis W Contrast  Result Date: 03/30/2018 CLINICAL DATA:  Abdominal pain, history of ulcerative colitis. EXAM: CT ABDOMEN AND PELVIS WITH CONTRAST TECHNIQUE: Multidetector CT imaging of the abdomen and pelvis was performed using the standard protocol following bolus administration of intravenous contrast. CONTRAST:  100 cc ISOVUE-300 IOPAMIDOL (ISOVUE-300) INJECTION 61% COMPARISON:  03/13/2012 FINDINGS: Lower chest: Normal size heart without pericardial effusion. Bibasilar dependent atelectasis. Hepatobiliary: Cholecystectomy. No space-occupying mass of the liver. There is mild intrahepatic ductal dilatation likely representing reservoir effect status post cholecystectomy. Pancreas: Mild atrophy of the pancreas.  No inflammation or mass. Spleen: Normal Adrenals/Urinary Tract: Normal bilateral adrenal glands and kidneys without obstructive uropathy. In located right ureter. The urinary bladder is unremarkable for the degree of distention. Stomach/Bowel: Small hiatal hernia. Decompressed stomach with normal small bowel rotation. Normal appendix. No small bowel inflammation. Liquid stool within the colon without mural thickening consistent with diarrheal disease. Scattered colonic diverticulosis is noted. Vascular/Lymphatic: No aortic aneurysm or dissection. No adenopathy. Reproductive: Coarse rounded calcification in the uterus measuring 3 mm in diameter likely representing a small calcified fibroid. No adnexal mass. Other: No free air nor free fluid. Musculoskeletal: No acute nor aggressive osseous lesions. IMPRESSION: 1. Liquid stool within the colon compatible with diarrheal disease. No significant mural thickening or inflammation. 2. Status post cholecystectomy. 3. Calcified uterine fibroid. Electronically Signed   By: Tollie Ethavid  Kwon M.D.   On: 03/30/2018 03:37    ED COURSE and MDM  Nursing notes and initial vitals signs, including pulse  oximetry, reviewed.  Vitals:   03/29/18 2202 03/30/18 0120  BP: (!) 154/95 (!) 152/89  Pulse: 74 (!) 57  Resp: 18 16  Temp: 97.9 F (36.6 C)   TempSrc: Oral   SpO2: 97% 98%  Weight: 61.2 kg   Height: 5\' 3"  (1.6 m)    3:54 AM Patient advised of reassuring CT scan.  We will forward this note to Dr. Marina GoodellPerry her gastroenterologist.  This is discussed with Dr. Christella HartiganJacobs, gastroenterologist on-call.  She will need to reschedule her colonoscopy.  PROCEDURES    ED DIAGNOSES     ICD-10-CM   1. Generalized abdominal pain R10.84        Paula LibraMolpus, Alferd Obryant, MD 03/30/18 [phone]

## 2018-03-30 NOTE — ED Notes (Signed)
ED Provider at bedside. 

## 2018-03-30 NOTE — ED Notes (Signed)
Patient transported to CT 

## 2018-03-31 ENCOUNTER — Telehealth: Payer: Self-pay | Admitting: Internal Medicine

## 2018-03-31 NOTE — Telephone Encounter (Signed)
Pt states she had bad abdominal pain after drinking the prep, she called the on call MD and was told to go to the er if not better. She was seen in the ER and wants to know if there is a different prep/medication that Dr. Marina GoodellPerry can prescribe that she would tolerated bettter. Please advise.

## 2018-03-31 NOTE — Telephone Encounter (Signed)
Left message for pt to call back  °

## 2018-04-01 ENCOUNTER — Other Ambulatory Visit: Payer: Self-pay

## 2018-04-01 ENCOUNTER — Telehealth: Payer: Self-pay | Admitting: Internal Medicine

## 2018-04-01 MED ORDER — NA SULFATE-K SULFATE-MG SULF 17.5-3.13-1.6 GM/177ML PO SOLN: ORAL | 0 refills | Status: DC

## 2018-04-01 MED ORDER — ONDANSETRON HCL 4 MG PO TABS: ORAL_TABLET | ORAL | 0 refills | Status: DC

## 2018-04-01 NOTE — Telephone Encounter (Signed)
See phone note from yesterday 

## 2018-04-01 NOTE — Telephone Encounter (Signed)
Try suprep. Provide zofran for nausea. Take 30 min before each prep session. She needs to read the prep sheets for advice as needed. There are multiple helpful hints

## 2018-04-01 NOTE — Telephone Encounter (Signed)
Pt aware, prescriptions sent to pharmacy and pt scheduled for colon 04/04/18@4pm . Pt aware of appt. Instructions sent to pt through mychart.

## 2018-04-02 ENCOUNTER — Telehealth: Payer: Self-pay | Admitting: Physician Assistant

## 2018-04-04 ENCOUNTER — Ambulatory Visit (AMBULATORY_SURGERY_CENTER): Payer: Medicaid Other | Admitting: Internal Medicine

## 2018-04-04 ENCOUNTER — Encounter: Payer: Self-pay | Admitting: Internal Medicine

## 2018-04-04 VITALS — BP 102/61 | HR 68 | Temp 98.6°F | Resp 14 | Ht 63.0 in | Wt 137.0 lb

## 2018-04-04 DIAGNOSIS — R194 Change in bowel habit: Secondary | ICD-10-CM | POA: Diagnosis not present

## 2018-04-04 DIAGNOSIS — K625 Hemorrhage of anus and rectum: Secondary | ICD-10-CM

## 2018-04-04 DIAGNOSIS — R197 Diarrhea, unspecified: Secondary | ICD-10-CM | POA: Diagnosis not present

## 2018-04-04 MED ORDER — SODIUM CHLORIDE 0.9 % IV SOLN: 500.0000 mL | Freq: Once | INTRAVENOUS | Status: DC

## 2018-04-04 NOTE — Patient Instructions (Addendum)
Thank you for allowing us to care for you today!  Resume previous diet and medications.  Return to normal activities tomorrow.  Await pathology results by mail, 1-2 weeks.  Repeat Colonoscopy in 10 years for screening purposes.  Recommend Metamucil (or store brandy equivalent) 2 Tablespoons daily 16 ounces of water or juice to improve bowel habits.  This will also help with regards to hemorrhoids.  Return to your Primary Care Physician regarding non-GI complaints such as shortness of breath.   YOU HAD AN ENDOSCOPIC PROCEDURE TODAY AT THE [facility]:   Refer to the procedure report that was given to you for any specific questions about what was found during the examination.  If the procedure report does not answer your questions, please call your gastroenterologist to clarify.  If you requested that your care partner not be given the details of your procedure findings, then the procedure report has been included in a sealed envelope for you to review at your convenience later.  YOU SHOULD EXPECT: Some feelings of bloating in the abdomen. Passage of more gas than usual.  Walking can help get rid of the air that was put into your GI tract during the procedure and reduce the bloating. If you had a lower endoscopy (such as a colonoscopy or flexible sigmoidoscopy) you may notice spotting of blood in your stool or on the toilet paper. If you underwent a bowel prep for your procedure, you may not have a normal bowel movement for a few days.  Please Note:  You might notice some irritation and congestion in your nose or some drainage.  This is from the oxygen used during your procedure.  There is no need for concern and it should clear up in a day or so.  SYMPTOMS TO REPORT IMMEDIATELY:   Following lower endoscopy (colonoscopy or flexible sigmoidoscopy):  Excessive amounts of blood in the stool  Significant tenderness or worsening of abdominal pains  Swelling of the abdomen that is  new, acute  Fever of 100F or higher     For urgent or emergent issues, a gastroenterologist can be reached at any hour by calling [phone].   DIET:  We do recommend a small meal at first, but then you may proceed to your regular diet.  Drink plenty of fluids but you should avoid alcoholic beverages for 24 hours.  ACTIVITY:  You should plan to take it easy for the rest of today and you should NOT DRIVE or use heavy machinery until tomorrow (because of the sedation medicines used during the test).    FOLLOW UP: Our staff will call the number listed on your records the next business day following your procedure to check on you and address any questions or concerns that you may have regarding the information given to you following your procedure. If we do not reach you, we will leave a message.  However, if you are feeling well and you are not experiencing any problems, there is no need to return our call.  We will assume that you have returned to your regular daily activities without incident.  If any biopsies were taken you will be contacted by phone or by letter within the next 1-3 weeks.  Please call us at [phone] if you have not heard about the biopsies in 3 weeks.    SIGNATURES/CONFIDENTIALITY: You and/or your care partner have signed paperwork which will be entered into your electronic medical record.  These signatures attest to the fact  that that the information above on your After Visit Summary has been reviewed and is understood.  Full responsibility of the confidentiality of this discharge information lies with you and/or your care-partner.

## 2018-04-04 NOTE — Progress Notes (Signed)
To recovery, report to RN, VSS. 

## 2018-04-04 NOTE — Op Note (Signed)
[facility] Patient Name: Jessica Randolph Procedure Date: 04/04/2018 3:45 PM MRN: [id number] Endoscopist: Wilhemina Bonito. Marina Goodell , MD Age: 62 Referring MD:  Date of Birth: January 17, 1956 Gender: Female Account #: [id number] Procedure:                Colonoscopy, with biopsies Indications:              Rectal bleeding. Also reports alternating bowel                            habits. Reported history of "ulcerative colitis". Medicines:                Monitored Anesthesia Care Procedure:                Pre-Anesthesia Assessment:                           - Prior to the procedure, a History and Physical                            was performed, and patient medications and                            allergies were reviewed. The patient's tolerance of                            previous anesthesia was also reviewed. The risks                            and benefits of the procedure and the sedation                            options and risks were discussed with the patient.                            All questions were answered, and informed consent                            was obtained. Prior Anticoagulants: The patient has                            taken no previous anticoagulant or antiplatelet                            agents. ASA Grade Assessment: II - A patient with                            mild systemic disease. After reviewing the risks                            and benefits, the patient was deemed in                            satisfactory condition to undergo the procedure.  After obtaining informed consent, the colonoscope                            was passed under direct vision. Throughout the                            procedure, the patient's blood pressure, pulse, and                            oxygen saturations were monitored continuously. The                            Model CF-HQ190L [id number](SN#[id number]) scope was introduced   through the anus and advanced to the the cecum,                            identified by appendiceal orifice and ileocecal                            valve. The ileocecal valve, appendiceal orifice,                            and rectum were photographed. The quality of the                            bowel preparation was good. The colonoscopy was                            performed without difficulty. The patient tolerated                            the procedure well. The bowel preparation used was                            SUPREP. Scope In: 3:51:32 PM Scope Out: 4:07:57 PM Scope Withdrawal Time: 0 hours 12 minutes 33 seconds  Total Procedure Duration: 0 hours 16 minutes 25 seconds  Findings:                 Multiple small and large-mouthed diverticula were                            found in the left colon.                           External and internal hemorrhoids.                           The entire examined colon appeared normal on direct                            and retroflexion views. Biopsies for histology were                            taken with a cold forceps from the entire colon for  evaluation of microscopic colitis. Complications:            No immediate complications. Estimated blood loss:                            None. Estimated Blood Loss:     Estimated blood loss: none. Impression:               - Diverticulosis in the left colon.                           - External and internal hemorrhoids. This is the                            cause forintermittent bleeding and mucous.                           - The entire examined colon is normal on direct and                            retroflexion views. NO EVIDENCE FOR ULCERATIVE                            COLITIS. Recommendation:           - Repeat colonoscopy in 10 years for screening                            purposes.                           - Patient has a contact number available  for                            emergencies. The signs and symptoms of potential                            delayed complications were discussed with the                            patient. Return to normal activities tomorrow.                            Written discharge instructions were provided to the                            patient.                           - Resume previous diet.                           - Continue present medications.                           - Await pathology results. We will send you a  letter with the results and any additional                            recommendations if necessary.                           - Recommend Metamucil 2 tablespoons daily in 16                            ounces of water or juice to improve bowel habits                            and bowel consistency. Also, will help with regards                            to hemorrhoids                           - Return to the care of your primary provider                            regarding non-GI complaints such as shortness of                            breath  N. Marina Goodell, MD 04/04/2018 4:17:36 PM This report has been signed electronically.

## 2018-04-04 NOTE — Progress Notes (Signed)
Called to room to assist during endoscopic procedure.  Patient ID and intended procedure confirmed with present staff. Received instructions for my participation in the procedure from the performing physician.  

## 2018-04-05 ENCOUNTER — Telehealth: Payer: Self-pay

## 2018-04-05 NOTE — Telephone Encounter (Signed)
  Follow up Call-  Call back number 04/04/2018  Post procedure Call Back phone  # [phone]  Permission to leave phone message Yes  Some recent data might be hidden     Patient questions:  Do you have a fever, pain , or abdominal swelling? No. Pain Score  0 *  Have you tolerated food without any problems? Yes.    Have you been able to return to your normal activities? Yes.    Do you have any questions about your discharge instructions: Diet   No. Medications  No. Follow up visit  No.  Do you have questions or concerns about your Care? No.  Actions: * If pain score is 4 or above: No action needed, pain <4.  Pt reported she place her discharge info in the pt belongings bag and left it on the stretcher.  Pt requested we mail her these papers.  Pt report, Discharge instructions, hemorrhoid and diverticulosis information will be mailed to pt. maw

## 2018-04-06 ENCOUNTER — Encounter: Payer: Self-pay | Admitting: Internal Medicine

## 2018-04-08 ENCOUNTER — Telehealth: Payer: Self-pay | Admitting: Internal Medicine

## 2018-04-08 MED ORDER — LINACLOTIDE 290 MCG PO CAPS: 290.0000 ug | ORAL_CAPSULE | Freq: Every day | ORAL | 3 refills | Status: DC

## 2018-04-08 NOTE — Telephone Encounter (Signed)
Try Linzess 290 mcg once daily

## 2018-04-08 NOTE — Telephone Encounter (Signed)
Left message for pt to call back  °

## 2018-04-08 NOTE — Telephone Encounter (Signed)
Patient states she has not had a BM since her procedure on 9.23.19 and is having some pain. Patient requesting to speak with nurse to discuss what to do.

## 2018-04-08 NOTE — Telephone Encounter (Signed)
Pt calling stating she "has to have something done." She reports she is having abdominal pain all across her abdomen below her belly button. She is concerned because she is not having regular bowel movements, states she is mainly passing mucous. She started taking metamucil on Tuesday and she is taking 2-3 doses of miralax/day and some dulcolax. Please advise.

## 2018-04-08 NOTE — Telephone Encounter (Signed)
Left message for pt to call back. Script sent to pharmacy. 

## 2018-04-11 NOTE — Telephone Encounter (Signed)
Letter mailed to pt.  

## 2018-04-25 ENCOUNTER — Ambulatory Visit: Payer: Medicaid Other | Admitting: Obstetrics & Gynecology

## 2018-04-27 ENCOUNTER — Ambulatory Visit: Payer: Medicaid Other | Admitting: Obstetrics and Gynecology

## 2018-05-03 ENCOUNTER — Ambulatory Visit: Payer: Medicaid Other | Admitting: Obstetrics and Gynecology

## 2018-05-03 ENCOUNTER — Other Ambulatory Visit (HOSPITAL_COMMUNITY)
Admission: RE | Admit: 2018-05-03 | Discharge: 2018-05-03 | Disposition: A | Discharge status: Home / Self Care | Payer: Medicaid Other | Source: Ambulatory Visit | Attending: Obstetrics and Gynecology | Admitting: Obstetrics and Gynecology

## 2018-05-03 ENCOUNTER — Encounter: Payer: Self-pay | Admitting: Obstetrics and Gynecology

## 2018-05-03 VITALS — BP 143/88 | HR 97 | Ht 63.0 in | Wt 138.9 lb

## 2018-05-03 DIAGNOSIS — Z01419 Encounter for gynecological examination (general) (routine) without abnormal findings: Secondary | ICD-10-CM | POA: Diagnosis present

## 2018-05-03 DIAGNOSIS — R102 Pelvic and perineal pain: Secondary | ICD-10-CM | POA: Diagnosis not present

## 2018-05-03 LAB — POCT URINALYSIS DIPSTICK
Bilirubin, UA: NEGATIVE
Glucose, UA: NEGATIVE
KETONES UA: NEGATIVE
LEUKOCYTES UA: NEGATIVE
NITRITE UA: NEGATIVE
PH UA: 7 (ref 5.0–8.0)
PROTEIN UA: NEGATIVE
RBC UA: NEGATIVE
Spec Grav, UA: 1.01 (ref 1.010–1.025)
Urobilinogen, UA: 0.2 E.U./dL

## 2018-05-03 NOTE — Progress Notes (Signed)
62 yo Jessica Randolph postmenopausal here for the evaluation of lower pelvic pain. Patient reports generalized abdominal pain and the presence of lower abdominal pain. She states that her abdominal pain is present daily, varying intensity and notices her pelvic pain occasionally. Patient reports pain has been present for the past several months but intensified over the past 6 weeks. She was seen in the Ed several time. She was seen by GI and had a colonoscopy and was diagnosed with diverticulosis. Patient states the pain comes and goes without any clear triggering factors. It is alleviated with tylenol and a heating pad. Patient is not sexually active. She denies any abnormal discharge or postmenopausal vaginal bleeding.   Past Medical History:  Diagnosis Date  . Allergy   . Arthritis   . Asthma   . CFS (chronic fatigue syndrome)   . Colitis   . Diverticulosis   . Dizzy spells   . Fibromyalgia   . GERD (gastroesophageal reflux disease)   . Stroke [facility])    Past Surgical History:  Procedure Laterality Date  . CHOLECYSTECTOMY  1985  . TONSILLECTOMY     Family History  Problem Relation Age of Onset  . Dementia Mother   . Congestive Heart Failure Mother   . Stroke Mother   . Diabetes Father   . Stroke Father   . Heart attack Father   . Colon cancer Neg Hx   . Esophageal cancer Neg Hx   . Stomach cancer Neg Hx   . Rectal cancer Neg Hx    Social History   Tobacco Use  . Smoking status: Never Smoker  . Smokeless tobacco: Never Used  Substance Use Topics  . Alcohol use: No  . Drug use: No   ROS See pertinent in HPI  Blood pressure (!) 143/88, pulse 97, height 5\' 3"  (1.6 m), weight 138 lb 14.4 oz (63 kg). GENERAL: Well-developed, well-nourished female in no acute distress.  BREASTS: Symmetric in size. No palpable masses or lymphadenopathy, skin changes, or nipple drainage. ABDOMEN: Soft, nontender, nondistended. No organomegaly. PELVIC: Normal external female genitalia. Vagina is pale pink  and atrophic.  Normal discharge. Normal appearing cervix. Uterus is normal in size. No adnexal mass or tenderness. EXTREMITIES: No cyanosis, clubbing, or edema, 2+ distal pulses.  Ct Abdomen Pelvis W Contrast  Result Date: 03/30/2018 CLINICAL DATA:  Abdominal pain, history of ulcerative colitis. EXAM: CT ABDOMEN AND PELVIS WITH CONTRAST TECHNIQUE: Multidetector CT imaging of the abdomen and pelvis was performed using the standard protocol following bolus administration of intravenous contrast. CONTRAST:  100 cc ISOVUE-300 IOPAMIDOL (ISOVUE-300) INJECTION 61% COMPARISON:  03/13/2012 FINDINGS: Lower chest: Normal size heart without pericardial effusion. Bibasilar dependent atelectasis. Hepatobiliary: Cholecystectomy. No space-occupying mass of the liver. There is mild intrahepatic ductal dilatation likely representing reservoir effect status post cholecystectomy. Pancreas: Mild atrophy of the pancreas.  No inflammation or mass. Spleen: Normal Adrenals/Urinary Tract: Normal bilateral adrenal glands and kidneys without obstructive uropathy. In located right ureter. The urinary bladder is unremarkable for the degree of distention. Stomach/Bowel: Small hiatal hernia. Decompressed stomach with normal small bowel rotation. Normal appendix. No small bowel inflammation. Liquid stool within the colon without mural thickening consistent with diarrheal disease. Scattered colonic diverticulosis is noted. Vascular/Lymphatic: No aortic aneurysm or dissection. No adenopathy. Reproductive: Coarse rounded calcification in the uterus measuring 3 mm in diameter likely representing a small calcified fibroid. No adnexal mass. Other: No free air nor free fluid. Musculoskeletal: No acute nor aggressive osseous lesions. IMPRESSION: 1. Liquid stool within  the colon compatible with diarrheal disease. No significant mural thickening or inflammation. 2. Status post cholecystectomy. 3. Calcified uterine fibroid. Electronically Signed   By:  Tollie Eth M.D.   On: 03/30/2018 03:37    A/P 62 yo with lower abdominal pain - pap smear and screening mammogram ordered as patient has not had one in several years - wet prep collected to rule out BV or yeast infection - Patient will be contacted with abnormal results - Based on the description of her pain and CT finding, there does not seem to be any gynecologic etiology for her pain. Patient advised to follow up with PCP for further evaluation - RTC prn

## 2018-05-03 NOTE — Progress Notes (Signed)
Pt states she has diverticulosis (colonoscopy a couple weeks ago showed this). Pt states she has had pelvic pain off and on for several months- states she can't really describe the pain but it is in the lower abdomen. Pt last pap unknown, years ago reported by patient. Pt states she has always had normal pap smears in the past. Last MMG was also years ago, normal reported by patient. Pt is postmenopausal, no menstrual cycle for 10 years. Pt states she did go to [facility] a couple weeks ago and they did a CT that found a "calcified uterine Fibroid" and a "hiatle hernia".

## 2018-05-04 LAB — CERVICOVAGINAL ANCILLARY ONLY
Bacterial vaginitis: NEGATIVE
CANDIDA VAGINITIS: NEGATIVE

## 2018-05-06 ENCOUNTER — Telehealth: Payer: Self-pay | Admitting: Internal Medicine

## 2018-05-06 LAB — CYTOLOGY - PAP: HPV: NOT DETECTED

## 2018-05-06 NOTE — Telephone Encounter (Signed)
Pt states she is still having issues with stomach pain, vomiting, and mucous in her stool. Pt thinks she has an infection in her stomach and that she needs an antibiotic. Pt scheduled to see Hyacinth Meeker PA 05/10/18@2 :15pm. Pt aware of appt.

## 2018-05-10 ENCOUNTER — Ambulatory Visit: Payer: Medicaid Other | Admitting: Physician Assistant

## 2018-05-24 ENCOUNTER — Telehealth: Payer: Self-pay

## 2018-05-24 ENCOUNTER — Other Ambulatory Visit: Payer: Self-pay | Admitting: Physician Assistant

## 2018-05-24 MED ORDER — HYOSCYAMINE SULFATE 0.125 MG SL SUBL: SUBLINGUAL_TABLET | SUBLINGUAL | 0 refills | Status: DC

## 2018-05-24 NOTE — Telephone Encounter (Signed)
Pt states the levsin helps with her cramping but she is calling for something stronger. Discussed with pt that we do not prescribe pain medication. Pt requesting a refill for the levsin, Refill sent to pharmacy for pt.

## 2018-06-01 ENCOUNTER — Ambulatory Visit: Payer: Medicaid Other | Admitting: Physician Assistant

## 2018-06-06 ENCOUNTER — Telehealth: Payer: Self-pay | Admitting: Internal Medicine

## 2018-06-06 NOTE — Telephone Encounter (Signed)
Patient states she is now upper gi symptoms such as acid reflux and wants advice until appt on 12.6.19 with APP Victorino DikeJennifer.

## 2018-06-06 NOTE — Telephone Encounter (Signed)
Discussed with pt that she can try increasing the prilosec to BID until her OV in December. Pt verbalized understanding.

## 2018-06-17 ENCOUNTER — Encounter: Payer: Self-pay | Admitting: Physician Assistant

## 2018-06-17 ENCOUNTER — Encounter

## 2018-06-17 ENCOUNTER — Ambulatory Visit: Payer: Medicaid Other | Admitting: Physician Assistant

## 2018-06-17 VITALS — BP 124/68 | HR 70 | Ht 63.0 in | Wt 136.0 lb

## 2018-06-17 DIAGNOSIS — K649 Unspecified hemorrhoids: Secondary | ICD-10-CM | POA: Diagnosis not present

## 2018-06-17 DIAGNOSIS — R1013 Epigastric pain: Secondary | ICD-10-CM

## 2018-06-17 DIAGNOSIS — K219 Gastro-esophageal reflux disease without esophagitis: Secondary | ICD-10-CM | POA: Diagnosis not present

## 2018-06-17 MED ORDER — HYOSCYAMINE SULFATE 0.125 MG SL SUBL: SUBLINGUAL_TABLET | SUBLINGUAL | 0 refills | Status: DC

## 2018-06-17 MED ORDER — HYDROCORTISONE 1 % EX OINT: 1.0000 "application " | TOPICAL_OINTMENT | Freq: Two times a day (BID) | CUTANEOUS | 0 refills | Status: AC

## 2018-06-17 MED ORDER — OMEPRAZOLE 20 MG PO CPDR: 20.0000 mg | DELAYED_RELEASE_CAPSULE | Freq: Two times a day (BID) | ORAL | 5 refills | Status: DC

## 2018-06-17 NOTE — Progress Notes (Signed)
Chief Complaint: GERD, hemorrhoids  HPI:    Jessica Randolph is a 62 year old female with a past medical history as listed below including chronic fatigue syndrome and fibromyalgia, known to Dr. Marina Goodell, who was referred to me by Marva Panda, NP for a complaint of GERD and hemorrhoids.      03/29/2018 office visit with Dr. Marina Goodell to discuss possible colitis.  At that time described chronic abdominal complaints and reflux.  She was scheduled for colonoscopy.    04/04/2018 colonoscopy with diverticulosis and internal and external hemorrhoids which was thought the cause for intermittent bleeding and mucus and no evidence of ulcerative colitis.  Repeat recommended in 10 years.    Today, the patient tells me that she has had some increased issues with reflux and epigastric burning in her stomach which she blames on her hiatal hernia.  Also describes occasional vomiting of an acid type material as well as some chest pressure/pain related to the symptoms.  Initially called our clinic and was told to increase her Omeprazole to 20 mg twice daily.  Patient tells me she did this for 3 or 4 days and this completely eradicated her symptoms, since then she has backed down to once daily dosing and has now started with symptoms again over the past couple of weeks.    Patient also tells me that she continues with some feeling of her hemorrhoids and intermittent bleeding.    Denies fever, chills, weight loss, nausea or symptoms that awaken her from sleep.  Past Medical History:  Diagnosis Date  . Allergy   . Arthritis   . Asthma   . CFS (chronic fatigue syndrome)   . Colitis   . Diverticulosis   . Dizzy spells   . Fibromyalgia   . GERD (gastroesophageal reflux disease)   . Stroke [facility])     Past Surgical History:  Procedure Laterality Date  . CHOLECYSTECTOMY  1985  . TONSILLECTOMY      Current Outpatient Medications  Medication Sig Dispense Refill  . acetaminophen (TYLENOL) 325 MG tablet Take 650 mg by  mouth every 6 (six) hours as needed for mild pain, moderate pain or headache.     Marland Kitchen aspirin 81 MG tablet Take 81 mg by mouth daily.    Marland Kitchen gemfibrozil (LOPID) 600 MG tablet Take 600 mg by mouth 2 (two) times daily before a meal.    . hyoscyamine (LEVSIN SL) 0.125 MG SL tablet TAKE 1 TABLET SUBLINGUALLY EVERY 6 HOURS AS NEEDED FOR ABDOMINAL PAIN/CRAMPING 40 tablet 0  . montelukast (SINGULAIR) 10 MG tablet Take 10 mg by mouth daily.     . Multiple Vitamin (MULTIVITAMIN) tablet Take 1 tablet by mouth daily.    . hydrocortisone 1 % ointment Apply 1 application topically 2 (two) times daily. Apply externally and to Preparation H suppository twice daily for two weeks. 30 g 0  . omeprazole (PRILOSEC) 20 MG capsule Take 1 capsule (20 mg total) by mouth 2 (two) times daily before a meal. Take one 30-60 minutes before breakfast and dinner 60 capsule 5   No current facility-administered medications for this visit.     Allergies as of 06/17/2018 - Review Complete 06/17/2018  Allergen Reaction Noted  . Erythromycin  10/14/2009  . Morphine and related Itching 03/29/2018  . Oxycodone  04/03/2015    Family History  Problem Relation Age of Onset  . Dementia Mother   . Congestive Heart Failure Mother   . Stroke Mother   . Diabetes Father   .  Stroke Father   . Heart attack Father   . Colon cancer Neg Hx   . Esophageal cancer Neg Hx   . Stomach cancer Neg Hx   . Rectal cancer Neg Hx     Social History   Socioeconomic History  . Marital status: Legally Separated    Spouse name: Not on file  . Number of children: 1  . Years of education: 90  . Highest education level: Not on file  Occupational History  . Occupation: Self employed  Social Needs  . Financial resource strain: Not on file  . Food insecurity:    Worry: Not on file    Inability: Not on file  . Transportation needs:    Medical: Not on file    Non-medical: Not on file  Tobacco Use  . Smoking status: Never Smoker  . Smokeless  tobacco: Never Used  Substance and Sexual Activity  . Alcohol use: No  . Drug use: No  . Sexual activity: Not Currently  Lifestyle  . Physical activity:    Days per week: Not on file    Minutes per session: Not on file  . Stress: Not on file  Relationships  . Social connections:    Talks on phone: Not on file    Gets together: Not on file    Attends religious service: Not on file    Active member of club or organization: Not on file    Attends meetings of clubs or organizations: Not on file    Relationship status: Not on file  . Intimate partner violence:    Fear of current or ex partner: Not on file    Emotionally abused: Not on file    Physically abused: Not on file    Forced sexual activity: Not on file  Other Topics Concern  . Not on file  Social History Narrative   Lives at home with mother and boyfriend   Caffeine use: daily    Review of Systems:    Constitutional: No weight loss, fever or chills Cardiovascular: No chest pain Respiratory: No SOB Gastrointestinal: See HPI and otherwise negative   Physical Exam:  Vital signs: BP 124/68   Pulse 70   Ht 5\' 3"  (1.6 m)   Wt 136 lb (61.7 kg)   BMI 24.09 kg/m   Constitutional:   Pleasant Caucasian female appears to be in NAD, Well developed, Well nourished, alert and cooperative Respiratory: Respirations even and unlabored. Lungs clear to auscultation bilaterally.   No wheezes, crackles, or rhonchi.  Cardiovascular: Normal S1, S2. No MRG. Regular rate and rhythm. No peripheral edema, cyanosis or pallor.  Gastrointestinal:  Soft, nondistended,moderate epigastric and RUQ ttp. No rebound or guarding. Normal bowel sounds. No appreciable masses or hepatomegaly. Rectal:  Not performed.  Psychiatric: Demonstrates good judgement and reason without abnormal affect or behaviors.  MOST RECENT LABS AND IMAGING: CBC    Component Value Date/Time   WBC 11.7 (H) 03/29/2018 2231   RBC 4.80 03/29/2018 2231   HGB 14.6 03/29/2018  2231   HCT 44.9 03/29/2018 2231   PLT 327 03/29/2018 2231   MCV 93.5 03/29/2018 2231   MCH 30.4 03/29/2018 2231   MCHC 32.5 03/29/2018 2231   RDW 13.1 03/29/2018 2231   LYMPHSABS 1.6 03/24/2018 1617   MONOABS 0.4 03/24/2018 1617   EOSABS 0.0 03/24/2018 1617   BASOSABS 0.0 03/24/2018 1617    CMP     Component Value Date/Time   NA 142 03/29/2018 2231   NA  141 04/03/2015 1452   K 3.9 03/29/2018 2231   CL 104 03/29/2018 2231   CO2 29 03/29/2018 2231   GLUCOSE 111 (H) 03/29/2018 2231   BUN 13 03/29/2018 2231   BUN 12 04/03/2015 1452   CREATININE 0.70 03/29/2018 2231   CALCIUM 9.3 03/29/2018 2231   PROT 7.3 03/29/2018 2231   ALBUMIN 4.2 03/29/2018 2231   AST 22 03/29/2018 2231   ALT 21 03/29/2018 2231   ALKPHOS 73 03/29/2018 2231   BILITOT 0.7 03/29/2018 2231   GFRNONAA >60 03/29/2018 2231   GFRAA >60 03/29/2018 2231    Assessment: 1.  GERD and epigastric pain: Likely related to gastritis + hiatal hernia 2.  Hemorrhoids: Bleed occasionally and irritating for the patient, recently seen at time of colonoscopy in September  Plan: 1.  Prescribed Hydrocortisone ointment to be applied to external hemorrhoids twice daily and also applied to Preparation H suppository twice daily for 14 days. 2.  Increased patient's Omeprazole to 20 mg twice daily.  #60 with 5 refills.  Discussed that if this controls the patient's symptoms it is fine for her to remain on this dose. 3.  Reviewed antireflux diet and lifestyle modifications. 4.  Patient to follow in clinic with me in 4 to 6 weeks or sooner if necessary.  Hyacinth MeekerJennifer Cicero Noy, PA-C [facility] 06/17/2018, 2:57 PM  Cc: Marva PandaMillsaps, Kimberly, NP

## 2018-06-17 NOTE — Progress Notes (Signed)
Assessment and plans reviewed  

## 2018-06-17 NOTE — Patient Instructions (Addendum)
If you are age 62 or older, your body mass index should be between 23-30. Your Body mass index is 24.09 kg/m. If this is out of the aforementioned range listed, please consider follow up with your Primary Care Provider.  If you are age 62 or younger, your body mass index should be between 19-25. Your Body mass index is 24.09 kg/m. If this is out of the aformentioned range listed, please consider follow up with your Primary Care Provider.   We have sent the following medications to your pharmacy for you to pick up at your convenience: Omeprazole Hyoscyamine Hydrocortisone Ointment  Follow up with me in 4-6 weeks.  Please call the office for an appointment in a week or so.  The schedule is not available at this time.  Thank you for choosing me and [facility].   Hyacinth MeekerJennifer Lemmon, PA-C

## 2018-06-23 ENCOUNTER — Inpatient Hospital Stay: Admission: RE | Admit: 2018-06-23 | Payer: Medicaid Other | Source: Ambulatory Visit

## 2018-11-10 ENCOUNTER — Other Ambulatory Visit: Payer: Self-pay

## 2018-11-10 MED ORDER — HYOSCYAMINE SULFATE 0.125 MG SL SUBL: SUBLINGUAL_TABLET | SUBLINGUAL | 0 refills | Status: AC

## 2019-06-12 ENCOUNTER — Other Ambulatory Visit: Payer: Self-pay | Admitting: Physician Assistant

## 2019-08-08 ENCOUNTER — Other Ambulatory Visit: Payer: Self-pay | Admitting: Physician Assistant

## 2019-08-31 ENCOUNTER — Other Ambulatory Visit: Payer: Self-pay | Admitting: Physician Assistant

## 2019-10-09 ENCOUNTER — Other Ambulatory Visit: Payer: Self-pay | Admitting: Physician Assistant

## 2019-11-09 ENCOUNTER — Other Ambulatory Visit: Payer: Self-pay | Admitting: Physician Assistant

## 2019-12-03 ENCOUNTER — Other Ambulatory Visit: Payer: Self-pay | Admitting: Physician Assistant

## 2020-01-22 ENCOUNTER — Emergency Department (HOSPITAL_COMMUNITY): Payer: Medicaid Other

## 2020-01-22 ENCOUNTER — Other Ambulatory Visit: Payer: Self-pay

## 2020-01-22 ENCOUNTER — Inpatient Hospital Stay (HOSPITAL_COMMUNITY)
Admission: EM | Admit: 2020-01-22 | Discharge: 2020-02-11 | DRG: 917 | Disposition: E | Payer: Medicaid Other | Attending: Pulmonary Disease | Admitting: Pulmonary Disease

## 2020-01-22 ENCOUNTER — Encounter (HOSPITAL_COMMUNITY): Payer: Self-pay | Admitting: *Deleted

## 2020-01-22 DIAGNOSIS — I609 Nontraumatic subarachnoid hemorrhage, unspecified: Secondary | ICD-10-CM

## 2020-01-22 DIAGNOSIS — Z8249 Family history of ischemic heart disease and other diseases of the circulatory system: Secondary | ICD-10-CM

## 2020-01-22 DIAGNOSIS — G92 Toxic encephalopathy: Secondary | ICD-10-CM | POA: Diagnosis present

## 2020-01-22 DIAGNOSIS — Z66 Do not resuscitate: Secondary | ICD-10-CM | POA: Diagnosis present

## 2020-01-22 DIAGNOSIS — T405X1A Poisoning by cocaine, accidental (unintentional), initial encounter: Secondary | ICD-10-CM | POA: Diagnosis present

## 2020-01-22 DIAGNOSIS — G935 Compression of brain: Secondary | ICD-10-CM | POA: Diagnosis present

## 2020-01-22 DIAGNOSIS — Z8673 Personal history of transient ischemic attack (TIA), and cerebral infarction without residual deficits: Secondary | ICD-10-CM

## 2020-01-22 DIAGNOSIS — G9340 Encephalopathy, unspecified: Secondary | ICD-10-CM | POA: Diagnosis present

## 2020-01-22 DIAGNOSIS — E785 Hyperlipidemia, unspecified: Secondary | ICD-10-CM | POA: Diagnosis present

## 2020-01-22 DIAGNOSIS — Y92003 Bedroom of unspecified non-institutional (private) residence as the place of occurrence of the external cause: Secondary | ICD-10-CM

## 2020-01-22 DIAGNOSIS — Z20822 Contact with and (suspected) exposure to covid-19: Secondary | ICD-10-CM | POA: Diagnosis present

## 2020-01-22 DIAGNOSIS — Z818 Family history of other mental and behavioral disorders: Secondary | ICD-10-CM

## 2020-01-22 DIAGNOSIS — I959 Hypotension, unspecified: Secondary | ICD-10-CM | POA: Diagnosis present

## 2020-01-22 DIAGNOSIS — F1410 Cocaine abuse, uncomplicated: Secondary | ICD-10-CM | POA: Diagnosis present

## 2020-01-22 DIAGNOSIS — R5382 Chronic fatigue, unspecified: Secondary | ICD-10-CM | POA: Diagnosis present

## 2020-01-22 DIAGNOSIS — I615 Nontraumatic intracerebral hemorrhage, intraventricular: Secondary | ICD-10-CM | POA: Diagnosis present

## 2020-01-22 DIAGNOSIS — J9601 Acute respiratory failure with hypoxia: Secondary | ICD-10-CM | POA: Diagnosis present

## 2020-01-22 DIAGNOSIS — R739 Hyperglycemia, unspecified: Secondary | ICD-10-CM | POA: Diagnosis present

## 2020-01-22 DIAGNOSIS — E872 Acidosis: Secondary | ICD-10-CM | POA: Diagnosis present

## 2020-01-22 DIAGNOSIS — Z833 Family history of diabetes mellitus: Secondary | ICD-10-CM | POA: Diagnosis not present

## 2020-01-22 DIAGNOSIS — M797 Fibromyalgia: Secondary | ICD-10-CM | POA: Diagnosis present

## 2020-01-22 DIAGNOSIS — R4189 Other symptoms and signs involving cognitive functions and awareness: Secondary | ICD-10-CM

## 2020-01-22 DIAGNOSIS — G9382 Brain death: Secondary | ICD-10-CM | POA: Diagnosis present

## 2020-01-22 DIAGNOSIS — J45909 Unspecified asthma, uncomplicated: Secondary | ICD-10-CM | POA: Diagnosis present

## 2020-01-22 DIAGNOSIS — K219 Gastro-esophageal reflux disease without esophagitis: Secondary | ICD-10-CM | POA: Diagnosis present

## 2020-01-22 DIAGNOSIS — I629 Nontraumatic intracranial hemorrhage, unspecified: Secondary | ICD-10-CM

## 2020-01-22 DIAGNOSIS — N179 Acute kidney failure, unspecified: Secondary | ICD-10-CM | POA: Diagnosis present

## 2020-01-22 DIAGNOSIS — I613 Nontraumatic intracerebral hemorrhage in brain stem: Secondary | ICD-10-CM | POA: Diagnosis not present

## 2020-01-22 DIAGNOSIS — Z823 Family history of stroke: Secondary | ICD-10-CM | POA: Diagnosis not present

## 2020-01-22 DIAGNOSIS — F1490 Cocaine use, unspecified, uncomplicated: Secondary | ICD-10-CM | POA: Diagnosis present

## 2020-01-22 LAB — CBC WITH DIFFERENTIAL/PLATELET
Abs Immature Granulocytes: 0.08 10*3/uL — ABNORMAL HIGH (ref 0.00–0.07)
Basophils Absolute: 0 10*3/uL (ref 0.0–0.1)
Basophils Relative: 0 %
Eosinophils Absolute: 0 10*3/uL (ref 0.0–0.5)
Eosinophils Relative: 0 %
HCT: 40.5 % (ref 36.0–46.0)
Hemoglobin: 12.4 g/dL (ref 12.0–15.0)
Immature Granulocytes: 1 %
Lymphocytes Relative: 4 %
Lymphs Abs: 0.6 10*3/uL — ABNORMAL LOW (ref 0.7–4.0)
MCH: 29.7 pg (ref 26.0–34.0)
MCHC: 30.6 g/dL (ref 30.0–36.0)
MCV: 96.9 fL (ref 80.0–100.0)
Monocytes Absolute: 0.6 10*3/uL (ref 0.1–1.0)
Monocytes Relative: 4 %
Neutro Abs: 12.3 10*3/uL — ABNORMAL HIGH (ref 1.7–7.7)
Neutrophils Relative %: 91 %
Platelets: 332 10*3/uL (ref 150–400)
RBC: 4.18 MIL/uL (ref 3.87–5.11)
RDW: 13.2 % (ref 11.5–15.5)
WBC: 13.5 10*3/uL — ABNORMAL HIGH (ref 4.0–10.5)
nRBC: 0 % (ref 0.0–0.2)

## 2020-01-22 LAB — I-STAT ARTERIAL BLOOD GAS, ED
Acid-base deficit: 4 mmol/L — ABNORMAL HIGH (ref 0.0–2.0)
Acid-base deficit: 1 mmol/L (ref 0.0–2.0)
Bicarbonate: 22.3 mmol/L (ref 20.0–28.0)
Bicarbonate: 21 mmol/L (ref 20.0–28.0)
Calcium, Ion: 1.09 mmol/L — ABNORMAL LOW (ref 1.15–1.40)
Calcium, Ion: 1.19 mmol/L (ref 1.15–1.40)
HCT: 26 % — ABNORMAL LOW (ref 36.0–46.0)
HCT: 32 % — ABNORMAL LOW (ref 36.0–46.0)
Hemoglobin: 8.8 g/dL — ABNORMAL LOW (ref 12.0–15.0)
Hemoglobin: 10.9 g/dL — ABNORMAL LOW (ref 12.0–15.0)
O2 Saturation: 100 %
O2 Saturation: 100 %
Patient temperature: 93.6
Potassium: 3.6 mmol/L (ref 3.5–5.1)
Potassium: 4.3 mmol/L (ref 3.5–5.1)
Sodium: 146 mmol/L — ABNORMAL HIGH (ref 135–145)
Sodium: 152 mmol/L — ABNORMAL HIGH (ref 135–145)
TCO2: 24 mmol/L (ref 22–32)
TCO2: 22 mmol/L (ref 22–32)
pCO2 arterial: 47.4 mmHg (ref 32.0–48.0)
pCO2 arterial: 23.3 mmHg — ABNORMAL LOW (ref 32.0–48.0)
pH, Arterial: 7.281 — ABNORMAL LOW (ref 7.350–7.450)
pH, Arterial: 7.553 — ABNORMAL HIGH (ref 7.350–7.450)
pO2, Arterial: 362 mmHg — ABNORMAL HIGH (ref 83.0–108.0)
pO2, Arterial: 254 mmHg — ABNORMAL HIGH (ref 83.0–108.0)

## 2020-01-22 LAB — URINALYSIS, ROUTINE W REFLEX MICROSCOPIC
Bacteria, UA: NONE SEEN
Bilirubin Urine: NEGATIVE
Glucose, UA: >=500 mg/dL — AB
Ketones, ur: NEGATIVE mg/dL
Leukocytes,Ua: NEGATIVE
Nitrite: NEGATIVE
Protein, ur: 30 mg/dL — AB
RBC / HPF: >50 RBC/hpf — ABNORMAL HIGH (ref 0–5)
Specific Gravity, Urine: 1.012 (ref 1.005–1.030)
pH: 6 (ref 5.0–8.0)

## 2020-01-22 LAB — COMPREHENSIVE METABOLIC PANEL
ALT: 14 U/L (ref 0–44)
AST: 32 U/L (ref 15–41)
Albumin: 4 g/dL (ref 3.5–5.0)
Alkaline Phosphatase: 67 U/L (ref 38–126)
Anion gap: 16 — ABNORMAL HIGH (ref 5–15)
BUN: 16 mg/dL (ref 8–23)
CO2: 19 mmol/L — ABNORMAL LOW (ref 22–32)
Calcium: 8.6 mg/dL — ABNORMAL LOW (ref 8.9–10.3)
Chloride: 107 mmol/L (ref 98–111)
Creatinine, Ser: 1.07 mg/dL — ABNORMAL HIGH (ref 0.44–1.00)
GFR calc Af Amer: >60 mL/min (ref >60)
GFR calc non Af Amer: 55 mL/min — ABNORMAL LOW (ref >60)
Glucose, Bld: 239 mg/dL — ABNORMAL HIGH (ref 70–99)
Potassium: 3.7 mmol/L (ref 3.5–5.1)
Sodium: 142 mmol/L (ref 135–145)
Total Bilirubin: 0.6 mg/dL (ref 0.3–1.2)
Total Protein: 6.8 g/dL (ref 6.5–8.1)

## 2020-01-22 LAB — PROTIME-INR
INR: 1.1 (ref 0.8–1.2)
Prothrombin Time: 13.7 seconds (ref 11.4–15.2)

## 2020-01-22 LAB — TYPE AND SCREEN
ABO/RH(D): O POS
Antibody Screen: NEGATIVE

## 2020-01-22 LAB — BRAIN NATRIURETIC PEPTIDE: B Natriuretic Peptide: 317.4 pg/mL — ABNORMAL HIGH (ref 0.0–100.0)

## 2020-01-22 LAB — SARS CORONAVIRUS 2 BY RT PCR (HOSPITAL ORDER, PERFORMED IN ~~LOC~~ HOSPITAL LAB): SARS Coronavirus 2: NEGATIVE

## 2020-01-22 LAB — MRSA PCR SCREENING: MRSA by PCR: NEGATIVE

## 2020-01-22 LAB — AMMONIA: Ammonia: 28 umol/L (ref 9–35)

## 2020-01-22 LAB — ACETAMINOPHEN LEVEL: Acetaminophen (Tylenol), Serum: <10 ug/mL — ABNORMAL LOW (ref 10–30)

## 2020-01-22 LAB — RAPID URINE DRUG SCREEN, HOSP PERFORMED
Amphetamines: NOT DETECTED
Barbiturates: NOT DETECTED
Benzodiazepines: NOT DETECTED
Cocaine: POSITIVE — AB
Opiates: NOT DETECTED
Tetrahydrocannabinol: NOT DETECTED

## 2020-01-22 LAB — HIV ANTIBODY (ROUTINE TESTING W REFLEX): HIV Screen 4th Generation wRfx: NONREACTIVE

## 2020-01-22 LAB — TROPONIN I (HIGH SENSITIVITY)
Troponin I (High Sensitivity): 29 ng/L — ABNORMAL HIGH (ref <18)
Troponin I (High Sensitivity): 55 ng/L — ABNORMAL HIGH (ref <18)

## 2020-01-22 LAB — ETHANOL: Alcohol, Ethyl (B): <10 mg/dL (ref <10)

## 2020-01-22 LAB — ABO/RH: ABO/RH(D): O POS

## 2020-01-22 LAB — PROCALCITONIN: Procalcitonin: 0.26 ng/mL

## 2020-01-22 LAB — MAGNESIUM: Magnesium: 1.8 mg/dL (ref 1.7–2.4)

## 2020-01-22 LAB — LIPASE, BLOOD: Lipase: 21 U/L (ref 11–51)

## 2020-01-22 LAB — LACTIC ACID, PLASMA
Lactic Acid, Venous: 5.2 mmol/L (ref 0.5–1.9)
Lactic Acid, Venous: 1.4 mmol/L (ref 0.5–1.9)

## 2020-01-22 MED ORDER — ACETAMINOPHEN 325 MG PO TABS: 650.0000 mg | ORAL_TABLET | Freq: Four times a day (QID) | ORAL | Status: DC | PRN

## 2020-01-22 MED ORDER — MORPHINE SULFATE (PF) 2 MG/ML IV SOLN: 2.0000 mg | INTRAVENOUS | Status: DC | PRN

## 2020-01-22 MED ORDER — MIDAZOLAM HCL 2 MG/2ML IJ SOLN: 2.0000 mg | INTRAMUSCULAR | Status: DC | PRN

## 2020-01-22 MED ORDER — GLYCOPYRROLATE 0.2 MG/ML IJ SOLN: 0.2000 mg | INTRAMUSCULAR | Status: DC | PRN

## 2020-01-22 MED ORDER — DIPHENHYDRAMINE HCL 50 MG/ML IJ SOLN: 25.0000 mg | INTRAMUSCULAR | Status: DC | PRN

## 2020-01-22 MED ORDER — NOREPINEPHRINE 4 MG/250ML-% IV SOLN: 2.0000 ug/min | INTRAVENOUS | Status: DC

## 2020-01-22 MED ORDER — MORPHINE BOLUS VIA INFUSION
5.0000 mg | INTRAVENOUS | Status: DC | PRN
  Administered 2020-01-22: 5 mg via INTRAVENOUS
  Filled 2020-01-22: qty 5

## 2020-01-22 MED ORDER — VASOPRESSIN 20 UNITS/100 ML INFUSION FOR SHOCK
0.0000 [IU]/min | INTRAVENOUS | Status: DC
  Administered 2020-01-22: 0.1 [IU]/min via INTRAVENOUS
  Filled 2020-01-22 (×2): qty 100

## 2020-01-22 MED ORDER — DEXTROSE 5 % IV SOLN: INTRAVENOUS | Status: DC

## 2020-01-22 MED ORDER — POLYETHYLENE GLYCOL 3350 17 G PO PACK: 17.0000 g | PACK | Freq: Every day | ORAL | Status: DC | PRN

## 2020-01-22 MED ORDER — INSULIN ASPART 100 UNIT/ML ~~LOC~~ SOLN: 0.0000 [IU] | SUBCUTANEOUS | Status: DC

## 2020-01-22 MED ORDER — SODIUM CHLORIDE 0.9 % IV BOLUS
1000.0000 mL | Freq: Once | INTRAVENOUS | Status: AC
  Administered 2020-01-22: 1000 mL via INTRAVENOUS

## 2020-01-22 MED ORDER — GLYCOPYRROLATE 1 MG PO TABS: 1.0000 mg | ORAL_TABLET | ORAL | Status: DC | PRN

## 2020-01-22 MED ORDER — SODIUM CHLORIDE 0.9 % IV SOLN: 250.0000 mL | INTRAVENOUS | Status: DC

## 2020-01-22 MED ORDER — PANTOPRAZOLE SODIUM 40 MG IV SOLR
40.0000 mg | Freq: Every day | INTRAVENOUS | Status: DC
  Administered 2020-01-22: 40 mg via INTRAVENOUS
  Filled 2020-01-22: qty 40

## 2020-01-22 MED ORDER — VASOPRESSIN 20 UNITS/100 ML INFUSION FOR SHOCK
0.0000 [IU]/min | INTRAVENOUS | Status: DC
  Filled 2020-01-22: qty 100

## 2020-01-22 MED ORDER — POLYVINYL ALCOHOL 1.4 % OP SOLN
1.0000 [drp] | Freq: Four times a day (QID) | OPHTHALMIC | Status: DC | PRN
  Filled 2020-01-22: qty 15

## 2020-01-22 MED ORDER — ORAL CARE MOUTH RINSE: 15.0000 mL | OROMUCOSAL | Status: DC

## 2020-01-22 MED ORDER — NOREPINEPHRINE 4 MG/250ML-% IV SOLN
INTRAVENOUS | Status: AC
  Filled 2020-01-22: qty 250

## 2020-01-22 MED ORDER — DOCUSATE SODIUM 100 MG PO CAPS: 100.0000 mg | ORAL_CAPSULE | Freq: Two times a day (BID) | ORAL | Status: DC | PRN

## 2020-01-22 MED ORDER — ACETAMINOPHEN 650 MG RE SUPP: 650.0000 mg | Freq: Four times a day (QID) | RECTAL | Status: DC | PRN

## 2020-01-22 MED ORDER — CHLORHEXIDINE GLUCONATE 0.12% ORAL RINSE (MEDLINE KIT): 15.0000 mL | Freq: Two times a day (BID) | OROMUCOSAL | Status: DC

## 2020-01-22 MED ORDER — MORPHINE 100MG IN NS 100ML (1MG/ML) PREMIX INFUSION
0.0000 mg/h | INTRAVENOUS | Status: DC
  Administered 2020-01-22: 5 mg/h via INTRAVENOUS
  Filled 2020-01-22: qty 100

## 2020-01-23 DIAGNOSIS — F1490 Cocaine use, unspecified, uncomplicated: Secondary | ICD-10-CM | POA: Diagnosis present

## 2020-01-23 DIAGNOSIS — G9382 Brain death: Secondary | ICD-10-CM | POA: Diagnosis present

## 2020-01-23 DIAGNOSIS — I609 Nontraumatic subarachnoid hemorrhage, unspecified: Secondary | ICD-10-CM

## 2020-01-23 DIAGNOSIS — I615 Nontraumatic intracerebral hemorrhage, intraventricular: Secondary | ICD-10-CM

## 2020-01-23 LAB — URINE CULTURE: Culture: NO GROWTH

## 2020-01-27 LAB — CULTURE, BLOOD (ROUTINE X 2)
Culture: NO GROWTH
Culture: NO GROWTH
Special Requests: ADEQUATE
Special Requests: ADEQUATE

## 2020-02-08 MED FILL — Sodium Chloride IV Soln 0.9%: INTRAVENOUS | Qty: 100 | Status: AC

## 2020-02-08 MED FILL — Vasopressin IV Soln 20 Unit/ML (For IV Infusion): INTRAVENOUS | Qty: 1 | Status: AC

## 2020-02-11 NOTE — H&P (Addendum)
NAME:  Jessica Randolph, MRN:  [id number], DOB:  10/16/1955, LOS: 0 ADMISSION DATE:  2020-02-16, CONSULTATION DATE:  Feb 16, 2020 REFERRING MD:  Dr. Charm Barges, CHIEF COMPLAINT:  unresponsive   Brief History   18 yoF with hx of cocaine abuse, last used last night and LSW around 2200 found unresponsive covered in emesis this morning.  Intubated in field, ongoing hypotension requiring vasopressor support, and poor neuro exam not requiring sedation.  CT head revealed large SAH.    History of present illness   HPI obtained from ER staff, medical chart review and per patients daughter, Jessica Randolph and patient's boyfriend.   64 year old female with prior history of cocaine abuse, HLD, arthritis, asthma, chronic fatigue syndrome, fibromyalgia, GERD, and diverticulosis presenting to ER after being found unresponsive.    Patient has one daughter and lives with her boyfriend of many years.  He states that she has complained of neck and back pain recently but that is not new for her and that she has been taking motrin.  Also states last use of cocaine was last night, that this is a regular habit for them, and he last seen her seen normal around 2200 on 7/11, as they sleep in different bedrooms.  Daughter states that her mother did drugs when she was younger but was unaware she was still using.  This morning he found her unresponsive covered in emesis.  Found by EMS unresponsive, CBG ok, hypotensive, unresponsive to narcan, dilated/ fixed pupils, and was intubated in field.  On arrival to ER, she was normothermic, ongoing hypotensive, normal sinus rhythm, saturating well on minimal ventilator support but remained unresponsive and not requiring sedation.  Labs pending but significant thus far for WBC 13.5, INR normal, LA 5.2, negative acetaminophen, BNP 317, ETOH neg, UDS positive for cocaine.  CMP pending.  PCCM called for admit.   Started on low dose peripheral levophed for ongoing hypotension despite 4 L NS.  Good UOP in ER  and noted foley temperature to be dropping, currently 93.6.  =CT head performed which showed subarachnoid hemorrhage noted in the basal cisterns and bilateral sylvian fissures with large amount of hemorrhage in the lateral, third, and fourth ventricles and mild dilation of the lateral ventricles and possible intraparenchymal hemorrhage in the left cerebellar hemisphere.   Neurology consulted.   Past Medical History  HLD Arthritis asthma Chronic fatigue syndrome Fibromyalgia GERD diverticulosis  Significant Hospital Events   7/12 admitted  Consults:  Neurology  Procedures:  7/12 ETT >>  Significant Diagnostic Tests:  7/12 CTH >> Large amount of hemorrhage is noted within the lateral, third and fourth ventricles. Mild dilatation of the lateral ventricles is noted suggesting obstruction. Possible intraparenchymal hemorrhage seen in left cerebellar hemisphere. Subarachnoid hemorrhage is also noted in the basal cisterns and bilateral Sylvian fissures. ADDENDUM: There is moderate to severe dilatation of the temporal horns suggesting significant ventriculomegaly secondary to obstruction. There is also noted compression of the brainstem due to surrounding hemorrhage. Periventricular white matter edema is also noted with probable sulcal effacement as well.  Micro Data:  7/12 SARS2 >> neg 7/12 BCx2 >>  7/12 UC >> 7/12 trach asp >>  Antimicrobials:   Interim history/subjective:  On levophed 2 mcg/min  Objective   Blood pressure (!) 62/35, pulse (!) 110, temperature (!) 97.4 F (36.3 C), temperature source Temporal, height 5\' 3"  (1.6 m), weight 67.7 kg, SpO2 95 %.    Vent Mode: PRVC FiO2 (%):  [40 %-100 %] 40 % Set  Rate:  [15 bmp-18 bmp] 18 bmp Vt Set:  [410 mL] 410 mL PEEP:  [5 cmH20] 5 cmH20 Plateau Pressure:  [13 cmH20] 13 cmH20  No intake or output data in the 24 hours ending 2020-02-11 1220 Filed Weights   February 11, 2020 1123  Weight: 67.7 kg   Examination: General:   Older adult female intubated on mechanical ventilation in NAD HEENT: MM pink/moist, ETT 7.0 at 23, OGT, pupils 7/ fixed, absent corneal's, anicteric  Neuro: unresponsive to noxious stimuli, no gag or cough CV: SR, no murmur PULM:  MV supported breaths- no triggering noted, coarse breath sounds, scant secretions GI: soft, bs hypo, ND, foley with increasing amount of urine output  Extremities: cool/dry, no LE edema  Skin: no rashes, upper trunk/ head with dried emesis   Resolved Hospital Problem list    Assessment & Plan:   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  Spoke with Dr. Maurice Small with NSGY, any surgical intervention at this time would not likely change patient's outcome.  Based on imaging and current exam, patient has an irreversible and non-survival brain injury.  This was fully explained with daughter and patient's boyfriend after permission of daughter with neurology at bedside.  Daughter is obviously distraught and needs time to process information.  We discussed if patient were to further decompensate to cardiac arrest, CPR would offer no benefit or change patient's outcome, and agreed to no CPR.  We will continue supportive care including vasopressor support at this time.   - monitor UOP closely for DI, may need to add vasopressin and recheck BMP - anticipate further brain death testing or transition to comfort care after family has had time to process information.    Hypotension- likely in the setting of severe neurological insult vs sepsis P:  S/p 4L NS Continue peripheral NE for MAP goal > 65, currently on 2 mcg/min, defer CVL placement for now Trend lactic acid  Pending trop hs- EKG non acute- showing prolonged QTc Follow culture data Pending PCT  Hold abx for now   Acute hypoxic respiratory failure in the setting above Possible aspiration injury given hx but currently CXR is clear and requiring minimal oxygenation on  mechanical ventilation P:  Full MV support, PRVC 6-8 ml/kg, rate 18 Repeat ABG in 1 hour Trend CXR VAP bundle/ PPI    AKI- in the setting of hypotension Lactic acidosis  P:  S/p 4L NS Trend UOP and renal indices    Hyperglycemia P:  SSI  Best practice:  Diet: NPO Pain/Anxiety/Delirium protocol (if indicated): not needed VAP protocol (if indicated): yes DVT prophylaxis: SCDs only  GI prophylaxis: PPI Glucose control: CBG sensitive Mobility: BR Code Status:  DNR- no CPR- continue vasopressor support Family Communication: Daughter, Jessica Randolph [phone] updated in conference room, she does not want to see her mother at this time in her condition Disposition: admit ICU  Labs   CBC: Recent Labs  Lab Feb 11, 2020 1133 2020-02-11 1150  WBC 13.5*  --   NEUTROABS 12.3*  --   HGB 12.4 8.8*  HCT 40.5 26.0*  MCV 96.9  --   PLT 332  --     Basic Metabolic Panel: Recent Labs  Lab 02-11-2020 1150  NA 146*  K 3.6   GFR: CrCl cannot be calculated (Patient's most recent lab result is older than the maximum 21 days allowed.). Recent Labs  Lab 02/11/2020 1133  WBC 13.5*    Liver Function Tests: No results for  input(s): AST, ALT, ALKPHOS, BILITOT, PROT, ALBUMIN in the last 168 hours. No results for input(s): LIPASE, AMYLASE in the last 168 hours. No results for input(s): AMMONIA in the last 168 hours.  ABG    Component Value Date/Time   PHART 7.281 (L) February 18, 2020 1150   PCO2ART 47.4 02-18-20 1150   PO2ART 362 (H) 02/18/2020 1150   HCO3 22.3 2020-02-18 1150   TCO2 24 02-18-20 1150   ACIDBASEDEF 4.0 (H) 2020-02-18 1150   O2SAT 100.0 2020/02/18 1150     Coagulation Profile: Recent Labs  Lab Feb 18, 2020 1133  INR 1.1    Cardiac Enzymes: No results for input(s): CKTOTAL, CKMB, CKMBINDEX, TROPONINI in the last 168 hours.  HbA1C: No results found for: HGBA1C  CBG: No results for input(s): GLUCAP in the last 168 hours.  Review of Systems:   Unable as patient is  unresponsive on mechanical ventilation.   Past Medical History  She,  has a past medical history of Allergy, Arthritis, Asthma, CFS (chronic fatigue syndrome), Colitis, Diverticulosis, Dizzy spells, Fibromyalgia, GERD (gastroesophageal reflux disease), and Stroke (HCC).   Surgical History    Past Surgical History:  Procedure Laterality Date  . CHOLECYSTECTOMY  1985  . TONSILLECTOMY       Social History   reports that she has never smoked. She has never used smokeless tobacco. She reports current drug use. She reports that she does not drink alcohol.   Family History   Her family history includes Congestive Heart Failure in her mother; Dementia in her mother; Diabetes in her father; Heart attack in her father; Stroke in her father and mother. There is no history of Colon cancer, Esophageal cancer, Stomach cancer, or Rectal cancer.   Allergies Allergies  Allergen Reactions  . Erythromycin Other (See Comments)    REACTION: stomach upset  . Morphine And Related Itching  . Oxycodone      Home Medications  Prior to Admission medications   Medication Sig Start Date End Date Taking? Authorizing Provider  acetaminophen (TYLENOL) 325 MG tablet Take 650 mg by mouth every 6 (six) hours as needed for mild pain, moderate pain or headache.     [provider]  aspirin 81 MG tablet Take 81 mg by mouth daily.    [provider]  gemfibrozil (LOPID) 600 MG tablet Take 600 mg by mouth 2 (two) times daily before a meal.    [provider]  hydrocortisone 1 % ointment Apply 1 application topically 2 (two) times daily. Apply externally and to Preparation H suppository twice daily for two weeks. 06/17/18   Unk Lightning, PA  hyoscyamine (LEVSIN SL) 0.125 MG SL tablet TAKE 1 TABLET SUBLINGUALLY EVERY 6 HOURS AS NEEDED FOR ABDOMINAL PAIN/CRAMPING 11/10/18   Hilarie Fredrickson, MD  montelukast (SINGULAIR) 10 MG tablet Take 10 mg by mouth daily.     [provider]    Multiple Vitamin (MULTIVITAMIN) tablet Take 1 tablet by mouth daily.    [provider]  omeprazole (PRILOSEC) 20 MG capsule TAKE 1 CAPSULE BY MOUTH TWICE A DAY BEFORE A MEAL (NEED OFFICE VISIT) 10/09/19   Unk Lightning, PA     Critical care time: 70 mins     Posey Boyer, MSN, AGACNP-BC [facility] 02-18-20, 12:20 PM  See Amion for personal pager PCCM on call pager [phone]   PCCM:  64 yo FM, cocaine abuse, used last night, found unresponsive this AM. CT head in ED with IVH and impending  herniation.   Neuro exam poor.   BP 91/69   Pulse (!) 56   Temp (!) 93.4 F (34.1 C)   Resp 18   Ht 5\' 3"  (1.6 m)   Wt 67.7 kg   SpO2 100%   BMI 26.44 kg/m   Gen: intubated  HENT: pupils fixed, dilated, no corneal, no cough, no gag  Heart: rrr, s1 s2 Lungs: BL vented breaths   Labs reviewed  Imaging reviewed with neurology   A:  SAH with IVH Cocaine overdose  Hypotension from severe neurologic insult  - this is not recoverable  AHRF secondary to above   P:  Recommend palliative withdraw to comfort measures  The family has been informed  They just needed some time to process this  We appreciate neuro input  If needed we can proceed with brain death testing  Would prefer not to do brain death apnea testing in ED Awaiting an ICU bed   DNR No escalation   This patient is critically ill with multiple organ system failure; which, requires frequent high complexity decision making, assessment, support, evaluation, and titration of therapies. This was completed through the application of advanced monitoring technologies and extensive interpretation of multiple databases. During this encounter critical care time was devoted to patient care services described in this note for 34 minutes.  Josephine IgoBradley L Juell Radney, DO [facility] Nov 15, 2019 5:59 PM

## 2020-02-11 NOTE — ED Notes (Signed)
Nor- epinephrine started at 2 mcq

## 2020-02-11 NOTE — Progress Notes (Signed)
This RN witnessed Dr. Myrlene Broker conversation with pt's son-in-law at bedside. Family has decided to proceed with terminal extubation.

## 2020-02-11 NOTE — ED Notes (Signed)
[facility]   Lawson Fiscal [phone]      Jorge Ny will be coming from [place] to talk with family

## 2020-02-11 NOTE — ED Notes (Addendum)
Pt remains unresponsive with no resp effort.  Pupils remain a #8 bil and non-reactive

## 2020-02-11 NOTE — ED Provider Notes (Signed)
[facility] EMERGENCY DEPARTMENT Provider Note   CSN: [id number] Arrival date & time: 03-Feb-2020  1111     History No chief complaint on file.   CHALET KERWIN is a 64 y.o. female.  Level 5 caveat secondary to unresponsive.  Last known well was 9 PM last night.  Positive crack use.  Found by boyfriend this morning sitting upright in bed slumped over with vomit over her.  EMS found her minimally responsive with poor respiratory effort.  Orally intubated.  No improvement with Narcan.  Low blood pressure.  No other history available at this time.  The history is provided by the EMS personnel. The history is limited by the condition of the patient.  Altered Mental Status Presenting symptoms: unresponsiveness   Severity:  Severe Most recent episode:  Today Episode history:  Single Timing:  Constant Progression:  Unchanged Chronicity:  New Context: drug use        Past Medical History:  Diagnosis Date  . Allergy   . Arthritis   . Asthma   . CFS (chronic fatigue syndrome)   . Colitis   . Diverticulosis   . Dizzy spells   . Fibromyalgia   . GERD (gastroesophageal reflux disease)   . Stroke [facility])     Patient Active Problem List   Diagnosis Date Noted  . Neck pain 11/12/2015  . Adjustment disorder with mixed anxiety and depressed mood 05/31/2015  . PTSD (post-traumatic stress disorder) 05/31/2015  . Right sided weakness 04/07/2015  . Paresthesia 04/07/2015  . Hemiparesis ([place]) 04/03/2015  . Sensory loss 04/03/2015  . Hemisensory loss 04/03/2015  . HYPERLIPIDEMIA 02/05/2010  . PLANTAR FASCIITIS, BILATERAL 02/05/2010  . HOT FLASHES 12/20/2009  . ALLERGIC RHINITIS 10/14/2009  . ASTHMA 10/14/2009  . POSTMENOPAUSAL SYNDROME 10/14/2009  . OSTEOARTHRITIS 10/14/2009  . FIBROMYALGIA 10/14/2009  . CHRONIC FATIGUE SYNDROME 10/14/2009    Past Surgical History:  Procedure Laterality Date  . CHOLECYSTECTOMY  1985  . TONSILLECTOMY       OB History     Gravida  1   Para  1   Term  1   Preterm      AB      Living  1     SAB      TAB      Ectopic      Multiple      Live Births  1           Family History  Problem Relation Age of Onset  . Dementia Mother   . Congestive Heart Failure Mother   . Stroke Mother   . Diabetes Father   . Stroke Father   . Heart attack Father   . Colon cancer Neg Hx   . Esophageal cancer Neg Hx   . Stomach cancer Neg Hx   . Rectal cancer Neg Hx     Social History   Tobacco Use  . Smoking status: Never Smoker  . Smokeless tobacco: Never Used  Vaping Use  . Vaping Use: Never used  Substance Use Topics  . Alcohol use: No  . Drug use: No    Home Medications Prior to Admission medications   Medication Sig Start Date End Date Taking? Authorizing Provider  acetaminophen (TYLENOL) 325 MG tablet Take 650 mg by mouth every 6 (six) hours as needed for mild pain, moderate pain or headache.     [provider]  aspirin 81 MG tablet Take 81 mg by mouth daily.  [provider]  gemfibrozil (LOPID) 600 MG tablet Take 600 mg by mouth 2 (two) times daily before a meal.    [provider]  hydrocortisone 1 % ointment Apply 1 application topically 2 (two) times daily. Apply externally and to Preparation H suppository twice daily for two weeks. 06/17/18   Levin Erp, PA  hyoscyamine (LEVSIN SL) 0.125 MG SL tablet TAKE 1 TABLET SUBLINGUALLY EVERY 6 HOURS AS NEEDED FOR ABDOMINAL PAIN/CRAMPING 11/10/18   Irene Shipper, MD  montelukast (SINGULAIR) 10 MG tablet Take 10 mg by mouth daily.     [provider]  Multiple Vitamin (MULTIVITAMIN) tablet Take 1 tablet by mouth daily.    [provider]  omeprazole (PRILOSEC) 20 MG capsule TAKE 1 CAPSULE BY MOUTH TWICE A DAY BEFORE A MEAL (NEED OFFICE VISIT) 10/09/19   Levin Erp, PA    Allergies    Erythromycin, Morphine and related, and Oxycodone  Review of Systems   Review of Systems    Unable to perform ROS: Patient unresponsive    Physical Exam Updated Vital Signs BP 91/69   Pulse (!) 56   Temp (!) 93.4 F (34.1 C)   Resp 18   Ht '5\' 3"'$  (1.6 m)   Wt 67.7 kg   SpO2 100%   BMI 26.44 kg/m   Physical Exam Vitals and nursing note reviewed.  Constitutional:      General: She is not in acute distress.    Appearance: She is well-developed. She is ill-appearing.  HENT:     Head: Normocephalic and atraumatic.     Mouth/Throat:     Comments: Orally intubated Eyes:     Conjunctiva/sclera: Conjunctivae normal.     Comments: Pupils probably 6 or 8 mm not responsive equal  Cardiovascular:     Rate and Rhythm: Regular rhythm. Tachycardia present.     Heart sounds: No murmur heard.   Pulmonary:     Effort: No respiratory distress.     Breath sounds: Rhonchi present.  Abdominal:     Palpations: Abdomen is soft.     Tenderness: There is no abdominal tenderness. There is no guarding or rebound.  Musculoskeletal:        General: No deformity. Normal range of motion.     Cervical back: Neck supple.  Skin:    General: Skin is warm and dry.  Neurological:     Comments: Patient with GCS of 3.  Intubated.  No withdraw to pain.     ED Results / Procedures / Treatments   Labs (all labs ordered are listed, but only abnormal results are displayed) Labs Reviewed  CBC WITH DIFFERENTIAL/PLATELET - Abnormal; Notable for the following components:      Result Value   WBC 13.5 (*)    Neutro Abs 12.3 (*)    Lymphs Abs 0.6 (*)    Abs Immature Granulocytes 0.08 (*)    All other components within normal limits  LACTIC ACID, PLASMA - Abnormal; Notable for the following components:   Lactic Acid, Venous 5.2 (*)    All other components within normal limits  COMPREHENSIVE METABOLIC PANEL - Abnormal; Notable for the following components:   CO2 19 (*)    Glucose, Bld 239 (*)    Creatinine, Ser 1.07 (*)    Calcium 8.6 (*)    GFR calc non Af Amer 55 (*)    Anion gap 16 (*)     All other components within normal limits  ACETAMINOPHEN LEVEL -  Abnormal; Notable for the following components:   Acetaminophen (Tylenol), Serum <10 (*)    All other components within normal limits  BRAIN NATRIURETIC PEPTIDE - Abnormal; Notable for the following components:   B Natriuretic Peptide 317.4 (*)    All other components within normal limits  URINALYSIS, ROUTINE W REFLEX MICROSCOPIC - Abnormal; Notable for the following components:   APPearance HAZY (*)    Glucose, UA >=500 (*)    Hgb urine dipstick LARGE (*)    Protein, ur 30 (*)    RBC / HPF >50 (*)    All other components within normal limits  RAPID URINE DRUG SCREEN, HOSP PERFORMED - Abnormal; Notable for the following components:   Cocaine POSITIVE (*)    All other components within normal limits  I-STAT ARTERIAL BLOOD GAS, ED - Abnormal; Notable for the following components:   pH, Arterial 7.281 (*)    pO2, Arterial 362 (*)    Acid-base deficit 4.0 (*)    Sodium 146 (*)    Calcium, Ion 1.09 (*)    HCT 26.0 (*)    Hemoglobin 8.8 (*)    All other components within normal limits  I-STAT ARTERIAL BLOOD GAS, ED - Abnormal; Notable for the following components:   pH, Arterial 7.553 (*)    pCO2 arterial 23.3 (*)    pO2, Arterial 254 (*)    Sodium 152 (*)    HCT 32.0 (*)    Hemoglobin 10.9 (*)    All other components within normal limits  TROPONIN I (HIGH SENSITIVITY) - Abnormal; Notable for the following components:   Troponin I (High Sensitivity) 29 (*)    All other components within normal limits  TROPONIN I (HIGH SENSITIVITY) - Abnormal; Notable for the following components:   Troponin I (High Sensitivity) 55 (*)    All other components within normal limits  SARS CORONAVIRUS 2 BY RT PCR (HOSPITAL ORDER, [facility])  CULTURE, BLOOD (ROUTINE X 2)  CULTURE, BLOOD (ROUTINE X 2)  URINE CULTURE  CULTURE, RESPIRATORY  MRSA PCR SCREENING  PROTIME-INR  LACTIC ACID, PLASMA  LIPASE, BLOOD    ETHANOL  HIV ANTIBODY (ROUTINE TESTING W REFLEX)  PROCALCITONIN  MAGNESIUM  AMMONIA  BASIC METABOLIC PANEL  BLOOD GAS, ARTERIAL  CBC  BASIC METABOLIC PANEL  I-STAT CHEM 8, ED  TYPE AND SCREEN  ABO/RH    EKG EKG Interpretation  Date/Time:  2020/02/06 11:26:06 EDT Ventricular Rate:  97 PR Interval:    QRS Duration: 86 QT Interval:  401 QTC Calculation: 510 R Axis:   68 Text Interpretation: Sinus rhythm RSR' in V1 or V2, right VCD or RVH Prolonged QT interval increased qtc from prior 9/19 Confirmed by Aletta Edouard [phone]) on Feb 06, 2020 11:30:55 AM   Radiology CT Head Wo Contrast  Result Date: 2020-02-06 CLINICAL DATA:  Altered mental status. EXAM: CT HEAD WITHOUT CONTRAST TECHNIQUE: Contiguous axial images were obtained from the base of the skull through the vertex without intravenous contrast. COMPARISON:  None. FINDINGS: Brain: Large amount of hemorrhage is noted within the lateral, third and fourth ventricles. Mild dilatation of the ventricles is noted. Crescent-shaped hemorrhage is noted in the inferior portion of left cerebellar hemisphere suggesting the possibility of intraparenchymal hemorrhage. No midline shift is noted. Mild chronic ischemic white matter disease is noted. Subarachnoid hemorrhage is also noted in the basal cisterns and bilateral sylvian fissures. Vascular: No hyperdense vessel or unexpected calcification. Skull: Normal. Negative for fracture or focal lesion.  Sinuses/Orbits: No acute finding. Other: None. IMPRESSION: Large amount of hemorrhage is noted within the lateral, third and fourth ventricles. Mild dilatation of the lateral ventricles is noted suggesting obstruction. Possible intraparenchymal hemorrhage seen in left cerebellar hemisphere. Subarachnoid hemorrhage is also noted in the basal cisterns and bilateral Sylvian fissures. Critical Value/emergent results were called by telephone at the time of interpretation on Feb 07, 2020 at 12:40 pm to  provider [facility] , who verbally acknowledged these results. Electronically Signed   By: Marijo Conception M.D.   On: 02-07-20 12:41   DG Chest Port 1 View  Result Date: February 07, 2020 CLINICAL DATA:  Status post intubation today. EXAM: PORTABLE CHEST 1 VIEW COMPARISON:  Single-view of the chest 07/15/2016. FINDINGS: Endotracheal tube is in place in good position with the tip at the clavicular heads. NG tube side port is seen at the gastroesophageal junction. The tube should be advanced 4-5 cm. Lungs are clear. Heart size is normal. No pneumothorax or pleural fluid. Difficult later pads noted. IMPRESSION: ETT in good position. NG tube side port is at the gastroesophageal junction. The tube should be advanced 4-5 cm. Lungs are clear. Electronically Signed   By: Inge Rise M.D.   On: February 07, 2020 11:38    Procedures .Critical Care Performed by: Hayden Rasmussen, MD Authorized by: Hayden Rasmussen, MD   Critical care provider statement:    Critical care time (minutes):  45   Critical care time was exclusive of:  Separately billable procedures and treating other patients   Critical care was necessary to treat or prevent imminent or life-threatening deterioration of the following conditions:  Circulatory failure, CNS failure or compromise and respiratory failure   Critical care was time spent personally by me on the following activities:  Discussions with consultants, evaluation of patient's response to treatment, examination of patient, ordering and performing treatments and interventions, ordering and review of laboratory studies, ordering and review of radiographic studies, pulse oximetry, re-evaluation of patient's condition, obtaining history from patient or surrogate, review of old charts and ventilator management   I assumed direction of critical care for this patient from another provider in my specialty: no     (including critical care time)  Medications Ordered in ED Medications    0.9 %  sodium chloride infusion (has no administration in time range)  norepinephrine (LEVOPHED) '4mg'$  in 215m premix infusion (has no administration in time range)  norepinephrine (LEVOPHED) 4-5 MG/250ML-% infusion SOLN (has no administration in time range)  docusate sodium (COLACE) capsule 100 mg (has no administration in time range)  polyethylene glycol (MIRALAX / GLYCOLAX) packet 17 g (has no administration in time range)  vasopressin (PITRESSIN) 20 Units in sodium chloride 0.9 % 100 mL infusion-*FOR SHOCK* (has no administration in time range)  chlorhexidine gluconate (MEDLINE KIT) (PERIDEX) 0.12 % solution 15 mL (has no administration in time range)  MEDLINE mouth rinse (has no administration in time range)  pantoprazole (PROTONIX) injection 40 mg (has no administration in time range)  sodium chloride 0.9 % bolus 1,000 mL (1,000 mLs Intravenous New Bag/Given [id number])    ED Course  I have reviewed the triage vital signs and the nursing notes.  Pertinent labs & imaging results that were available during my care of the patient were reviewed by me and considered in my medical decision making (see chart for details).  Clinical Course as of Jan 21 1802  M28-Jul-2021 1132 Chest x-ray interpreted by me as ET tube in good  position NG below the diaphragm no gross infiltrates.   [MB]  5947 Discussed with critical care.  They said they probably just need a head CT for now and they will come down to evaluate.   [MB]  0761 Received a call from radiology.  Patient has a significant amount of intraventricular and subarachnoid blood.  Showing some signs of obstruction.   [MB]  5183 I was informed that critical care is going to talk with neurology and neurosurgery.   [MB]  4373 Reevaluated patient, blood pressure up to 100.  Remains fixed and dilated, no response to any stimulation.  Not overbreathing the vent.   [MB]    Clinical Course User Index [MB] Hayden Rasmussen, MD   MDM  Rules/Calculators/A&P                         This patient complains of unresponsive, respiratory failure,; this involves an extensive number of treatment Options and is a complaint that carries with it a high risk of complications and Morbidity. The differential includes aspiration, overdose, bleed, ACS, metabolic derangement.  I ordered, reviewed and interpreted labs, which included CBC with elevated white count of 13, normal hemoglobin, chemistries with elevated glucose low bicarb and creatinine reflecting some volume depletion, urinalysis with microscopic hematuria, normal coags, negative aspirin Tylenol alcohol, elevated troponin and BNP I ordered medication IV fluids I ordered imaging studies which included portable chest x-ray and CT head and I independently    visualized and interpreted imaging which showed ET tube in good position, significant intracranial hemorrhage subarachnoid and intraventricular blood Additional history obtained from EMS Previous records obtained and reviewed in epic, nothing significant I consulted critical care and discussed lab and imaging findings  Critical Interventions: Work-up and management of her hypotension and unresponsiveness vent management  After the interventions stated above, I reevaluated the patient and found patient to remain with no signs of neurologic function.  Critical care has consulted neurology and neurosurgery.  Patient's daughter was contacted by critical care.  It sounds like they are leaning towards withdrawal of care but at this point at least she is considering DNR.  Awaiting transfer up to unit.   Final Clinical Impression(s) / ED Diagnoses Final diagnoses:  Intracranial bleeding ([place])  Unresponsive state  Cocaine abuse ([place])  Acute respiratory failure with hypoxia [facility])    Rx / DC Orders ED Discharge Orders    None       Hayden Rasmussen, MD 26-Jan-2020 [phone]

## 2020-02-11 NOTE — Consult Note (Addendum)
Neurology Consultation  Reason for Consult: Intracranial hemorrhage/subarachnoid hemorrhage/herniation and prognostication Referring Physician: Dr. June Leap  CC: Nonresponsive  History is obtained from: Family and EMS.  Boyfriend gave minimal history  HPI: Jessica Randolph is a 64 y.o. female with past medical history of stroke, fibromyalgia, dizzy spells, chronic fatigue syndrome and substance abuse.  Per boyfriend, patient and boyfriend were partaking in cocaine last night.  At 2200 hrs. patient went to sleep in her bedroom.  The next morning-02-21-2020-boyfriend noted that patient was not responding when he knocked on the door.  He broke the door down and found the patient nonresponsive, covered in vomit, shallow respirations.  EMS was called.  On arrival it was noted that patient had shallow respirations thus was intubated.  Patient did not need any sedating medications to intubate.  On arrival to emergency department patient was not breathing over the vent, was significantly hypotensive with exam showing no cranial nerve function.  CT of brain was obtained showing significant intraventricular, intraparenchymal, and subarachnoid hemorrhage intracranially.  There is also component of herniation.  At that point boyfriend did admit that when he walked into the room he noted a empty bottle of ibuprofen, which she used in conjunction with cocaine.  It was at that time that he admitted they had taken part in cocaine use that night before.  Boyfriend also stated that it was a routine habit for both of them.  Daughter also noted that patient had been partaking in cocaine use since she can recall from childhood.   LKW: 2200 01/21/2020 tpa given?: no, Premorbid modified Rankin scale (mRS): 0 ICH Score: 5   Past Medical History:  Diagnosis Date  . Allergy   . Arthritis   . Asthma   . CFS (chronic fatigue syndrome)   . Colitis   . Diverticulosis   . Dizzy spells   . Fibromyalgia   . GERD  (gastroesophageal reflux disease)   . Stroke [facility])     Family History  Problem Relation Age of Onset  . Dementia Mother   . Congestive Heart Failure Mother   . Stroke Mother   . Diabetes Father   . Stroke Father   . Heart attack Father   . Colon cancer Neg Hx   . Esophageal cancer Neg Hx   . Stomach cancer Neg Hx   . Rectal cancer Neg Hx    Social History:   reports that she has never smoked. She has never used smokeless tobacco. She reports current drug use. She reports that she does not drink alcohol.  Medications  Current Facility-Administered Medications:  .  0.9 %  sodium chloride infusion, 250 mL, Intravenous, Continuous, Simpson, Paula B, NP .  chlorhexidine gluconate (MEDLINE KIT) (PERIDEX) 0.12 % solution 15 mL, 15 mL, Mouth Rinse, BID, Simpson, Paula B, NP .  docusate sodium (COLACE) capsule 100 mg, 100 mg, Oral, BID PRN, Jennelle Human B, NP .  MEDLINE mouth rinse, 15 mL, Mouth Rinse, 10 times per day, Jennelle Human B, NP .  norepinephrine (LEVOPHED) 4-5 MG/250ML-% infusion SOLN, , , ,  .  norepinephrine (LEVOPHED) '4mg'$  in 264m premix infusion, 2-10 mcg/min, Intravenous, Titrated, Simpson, Paula B, NP .  pantoprazole (PROTONIX) injection 40 mg, 40 mg, Intravenous, Daily, SJennelle HumanB, NP .  polyethylene glycol (MIRALAX / GLYCOLAX) packet 17 g, 17 g, Oral, Daily PRN, SJennelle HumanB, NP .  vasopressin (PITRESSIN) 20 Units in sodium chloride 0.9 % 100 mL infusion-*FOR SHOCK*, 0-0.03 Units/min, Intravenous, Continuous,  Norton Blizzard, NP  Current Outpatient Medications:  .  aspirin 81 MG tablet, Take 81 mg by mouth daily., Disp: , Rfl:  .  B Complex-C (SUPER B COMPLEX PO), Take 1 tablet by mouth daily., Disp: , Rfl:  .  cyclobenzaprine (FLEXERIL) 5 MG tablet, Take 5 mg by mouth 3 (three) times daily as needed for muscle spasms., Disp: , Rfl:  .  diphenhydrAMINE (BENADRYL) 25 mg capsule, Take 25 mg by mouth every 6 (six) hours as needed for itching, allergies or  sleep., Disp: , Rfl:  .  gemfibrozil (LOPID) 600 MG tablet, Take 600 mg by mouth 2 (two) times daily before a meal., Disp: , Rfl:  .  montelukast (SINGULAIR) 10 MG tablet, Take 10 mg by mouth daily. , Disp: , Rfl:  .  Multiple Minerals-Vitamins (CALCIUM & VIT D3 BONE HEALTH PO), Take 1 tablet by mouth daily., Disp: , Rfl:  .  Multiple Vitamin (MULTIVITAMIN) tablet, Take 1 tablet by mouth daily., Disp: , Rfl:  .  omeprazole (PRILOSEC) 20 MG capsule, TAKE 1 CAPSULE BY MOUTH TWICE A DAY BEFORE A MEAL (NEED OFFICE VISIT) (Patient taking differently: Take 20 mg by mouth 2 (two) times daily as needed (acid reflux). ), Disp: 60 capsule, Rfl: 0 .  Probiotic Product (PROBIOTIC PO), Take 1 capsule by mouth daily., Disp: , Rfl:  .  traMADol (ULTRAM) 50 MG tablet, Take 50 mg by mouth every 6 (six) hours as needed for moderate pain., Disp: , Rfl:  .  vitamin E (VITAMIN E) 180 MG (400 UNITS) capsule, Take 400 Units by mouth daily., Disp: , Rfl:  .  acetaminophen (TYLENOL) 325 MG tablet, Take 650 mg by mouth every 6 (six) hours as needed for mild pain, moderate pain or headache. , Disp: , Rfl:  .  hydrocortisone 1 % ointment, Apply 1 application topically 2 (two) times daily. Apply externally and to Preparation H suppository twice daily for two weeks., Disp: 30 g, Rfl: 0 .  hyoscyamine (LEVSIN SL) 0.125 MG SL tablet, TAKE 1 TABLET SUBLINGUALLY EVERY 6 HOURS AS NEEDED FOR ABDOMINAL PAIN/CRAMPING, Disp: 40 tablet, Rfl: 0  ROS: Unable to obtain due to altered mental status.    Exam: Current vital signs: BP (!) 62/35 (BP Location: Left Arm)   Pulse (!) 110   Temp (!) 97.4 F (36.3 C) (Temporal)   Ht 5\' 3"  (1.6 m)   Wt 67.7 kg   SpO2 95%   BMI 26.44 kg/m  Vital signs in last 24 hours: Temp:  [97.4 F (36.3 C)] 97.4 F (36.3 C) (07/12 1121) Pulse Rate:  [110] 110 (07/12 1121) BP: (62)/(35) 62/35 (07/12 1121) SpO2:  [95 %-97 %] 95 % (07/12 1121) FiO2 (%):  [40 %-100 %] 40 % (07/12 1155) Weight:  [67.7  kg] 67.7 kg (07/12 1123) Constitutional: Appears well-developed and well-nourished.  Eyes: No scleral injection HENT: No OP obstrucion Head: Normocephalic.  Cardiovascular: Normal rate and regular rhythm.  Respiratory: On ventilator not breathing over the vent GI: Soft.  No distension. There is no tenderness.  Skin: WDI  Neuro: Mental Status: Patient does not respond to verbal stimuli.  Does not respond to deep sternal rub.  Does not follow commands.  No verbalizations noted.  Cranial Nerves: II: patient does not respond confrontation bilaterally,  III,IV,VI: doll's response absent bilaterally. pupils right 7 mm, left 7 mm,and nonreactive bilaterally V,VII: corneal reflex absent bilaterally  VIII: patient does not respond to verbal stimuli IX,X: gag reflex absent, XI:  trapezius strength unable to test bilaterally XII: tongue strength unable to test Motor: Extremities flaccid throughout.  No spontaneous movement noted.  No purposeful movements noted. Sensory: Does not respond to noxious stimuli in any extremity. Deep Tendon Reflexes:  Absent throughout. Plantars: equivocal bilaterally Cerebellar: Unable to perform  Labs I have reviewed labs in epic and the results pertinent to this consultation are:  CBC    Component Value Date/Time   WBC 13.5 (H) Feb 21, 2020 1133   RBC 4.18 2020-02-21 1133   HGB 8.8 (L) 2020/02/21 1150   HCT 26.0 (L) 02-21-20 1150   PLT 332 02/21/2020 1133   MCV 96.9 Feb 21, 2020 1133   MCH 29.7 2020-02-21 1133   MCHC 30.6 2020/02/21 1133   RDW 13.2 February 21, 2020 1133   LYMPHSABS 0.6 (L) February 21, 2020 1133   MONOABS 0.6 2020-02-21 1133   EOSABS 0.0 02/21/2020 1133   BASOSABS 0.0 02-21-20 1133    CMP     Component Value Date/Time   NA 146 (H) 2020-02-21 1150   NA 141 04/03/2015 1452   K 3.6 02/21/20 1150   CL 104 03/29/2018 2231   CO2 29 03/29/2018 2231   GLUCOSE 111 (H) 03/29/2018 2231   BUN 13 03/29/2018 2231   BUN 12 04/03/2015 1452    CREATININE 0.70 03/29/2018 2231   CALCIUM 9.3 03/29/2018 2231   PROT 7.3 03/29/2018 2231   ALBUMIN 4.2 03/29/2018 2231   AST 22 03/29/2018 2231   ALT 21 03/29/2018 2231   ALKPHOS 73 03/29/2018 2231   BILITOT 0.7 03/29/2018 2231   GFRNONAA >60 03/29/2018 2231   GFRAA >60 03/29/2018 2231    Lipid Panel     Component Value Date/Time   CHOL 181 02/05/2010 2122   TRIG 220 (H) 02/05/2010 2122   HDL 43 02/05/2010 2122   CHOLHDL 4.2 Ratio 02/05/2010 2122   VLDL 44 (H) 02/05/2010 2122   LDLCALC 94 02/05/2010 2122     Imaging I have reviewed the images obtained:  CT-scan of the brain-large amount of hemorrhage is noted within the lateral, third and fourth ventricle.  Mild dilatation of the lateral ventricles is noted suggesting obstruction.  Possible intraparenchymal hemorrhage seen in the left cerebellar hemisphere.  Subarachnoid hemorrhage is also noted in the basal cisterns and bilateral sylvian fissures. ADDENDUM: There is moderate to severe dilatation of the temporal horns suggesting significant ventriculomegaly secondary to obstruction. There is also noted compression of the brainstem due to surrounding hemorrhage. Periventricular white matter edema is also noted with probable sulcal effacement as well.  Etta Quill PA-C [facility] [phone]  M-F  (9:00 am- 5:00 PM)  February 21, 2020, 1:09 PM   Assessment:  64 year old female presented to the hospital nonresponsive via EMS.  In route EMS was able to intubate without the need of sedating drugs.  CT obtained shows significant intracranial hemorrhage/intraventricular hemorrhage and subarachnoid hemorrhage with a component of herniation.  Given patient was last seen normal at 10 PM on 01/21/2020, exam showing no cortical or cranial nerve function and CT scanning of brain showing current herniation, this would fall into the parameters most consistent with severe brain injury.  ICH score of 5 also gives a 30-day  prognostication of 100% mortality.  However, given the fact patient was under the influence of cocaine it would be reasonable to give patient a few more hours. However, daughter understood grave prognostication and was against this.  Neurosurgery was also consulted and agreed that there was no intervention that could be useful at this time.  This was fully explained to the daughter who understood the information.   Attending addendum Patient seen and examined at the request of Dr. Leory Plowman Icard from critical care medicine. Patient was brought in after being found down unresponsive at home with last known normal somewhere around 9 PM to 10 PM on 01/21/2020 when she had last used cocaine with her boyfriend.  According to him, they use cocaine pretty frequently.  He went this morning to see if she would help him with clean up of the animals as they did every Monday morning but she would not respond, on entering the room he found her unresponsive, with vomiting all around.  EMS was called.  They found her to be breathing agonal he and intubated emergently for GCS 3 without any sedation.  Initially hypertensive followed by being hypotensive In the emergency room, she continued to be GCS 3.  Noncontrast head CT was obtained which showed large amount of hemorrhage within the lateral third and fourth ventricles with moderate dilatation of the lateral ventricle suggesting obstruction along with intraparenchymal hemorrhage and subarachnoid hemorrhage in the basal cisterns and bilateral sylvian fissures as well as intraparenchymal hemorrhage in the left cerebellar hemisphere.  There is small amount of downward herniation at the level of the foramen magnum. On my examination, the patient is ventilated, breathing with the ventilator, no spontaneous movements, no response to noxious stimulation, pupils 7 mm fixed and dilated, no doll's eye reflexes, no cough or gag, no corneal reflexes bilaterally. Urinary toxicology  screen is positive for cocaine.  No other drug abuse reported by her boyfriend.  Assessment and recommendations: 85 year old with past medical history of cocaine abuse, brought in unresponsive with last known normal around 9:51 PM yesterday, with an examination consistent with absent brainstem function and neck CT scan of the head showing extensive hemorrhage in the lateral third and fourth ventricles with intraparenchymal hemorrhage in the cerebellar hemisphere as well as subarachnoid hemorrhage in the basal cisterns and bilateral sylvian fissures likely secondary to hypertensive emergency from cocaine use. Her clinical exam, is consistent at this time with brain death with the caveat that her urine was positive for cocaine.  CT scan also showed minimal amount of downward herniation and pupils are fixed dilated along with no brainstem reflexes present. I had a discussion with the daughter, explained the nature of the devastating bleed which is likely nonsurvivable. I did discuss the case with neurosurgeon on-call who was consulted for the possibility of placing an EVD but looking at the scan, history and the exam, that was very less likely to make any change to the ultimate outcome. The family has been made aware of the above.  Primary team has spoken with the daughter who was, as expected, very shocked.  They will continue to have discussions regarding the next steps which include apnea testing and confirmation of brain death at this time versus withdrawal of care with the knowledge that this is a devastating bleed, which even if by some slim chance she survives, would leave her completely debilitated.  Please call neurology if I can be of any further assistance.   -- Amie Portland, MD [facility] Pager: [phone] If 7pm to 7am, please call on call as listed on AMION.   CRITICAL CARE ATTESTATION Performed by: Amie Portland, MD Total critical care time: 45 minutes Critical care  time was exclusive of separately billable procedures and treating other patients and/or supervising APPs/Residents/Students Critical care was necessary to treat or prevent imminent or  life-threatening deterioration due to intracerebral hemorrhage, cerebral herniation, brainstem compression,coma This patient is critically ill and at significant risk for neurological worsening and/or death and care requires constant monitoring. Critical care was time spent personally by me on the following activities: development of treatment plan with patient and/or surrogate as well as nursing, discussions with consultants, evaluation of patient's response to treatment, examination of patient, obtaining history from patient or surrogate, ordering and performing treatments and interventions, ordering and review of laboratory studies, ordering and review of radiographic studies, pulse oximetry, re-evaluation of patient's condition, participation in multidisciplinary rounds and medical decision making of high complexity in the care of this patient.

## 2020-02-11 NOTE — ED Triage Notes (Signed)
Patient presents to ed via [facility] states they were called by patient boyfriend who found her unresp. This am last seen at 9 pm last night boyfriend admits they smoked crack. Upon  Ems arrival patient was clearly unresp, agonal resp.  Intubated with 7.5 ET tube, states patient didn't have a gag reflex. Patient had vomited. Patient was given Narcan 4 mg without resp, pupils 8 bilaterally an non-reactive.

## 2020-02-11 NOTE — ED Notes (Signed)
[facility] notified spoke with Felipa Eth  [phone]

## 2020-02-11 NOTE — Progress Notes (Signed)
PCCM:  I spoke with patients son-in-law and daughter. They would like to proceed with terminal extubation.   CCM withdrawal of life sustaining treatment orderset was placed.   Nursing was present at bedside for conversation.   DNR   Jessica Igo, DO [facility] 02/02/20 6:47 PM

## 2020-02-11 NOTE — Progress Notes (Signed)
Ventilator patient transported from ED to 2M07 without any complications.

## 2020-02-11 NOTE — Progress Notes (Signed)
55cc IV Morphine wasted in sink , witnessed by Vincent Gros, RN.

## 2020-02-11 NOTE — Progress Notes (Signed)
Patient is a DNR and family confirms their wishes of terminally extubation. Patients son-in-law, Thayer Ohm, is tearful at bedside. Emotional support given by staff.   Patient cardiac time of death 20:24pm. Pupils are fixed and dilatated. Patient is pulseless. No breaths sounds nor heart tones auscultated. Patients death pronounced by Judy Pimple, RN and Tory Emerald, RN.  Dr Sharol Roussel notified of cardiac time of death.  Will continue to provide emotional support to patient and family. Family appreciative of care received during this difficult time .

## 2020-02-11 NOTE — Procedures (Signed)
Extubation Procedure Note  Patient Details:   Name: Jessica Randolph DOB: 1955-11-02 MRN: [id number]   Airway Documentation:    Vent end date: (not recorded) Vent end time: (not recorded)   Evaluation  O2 sats: stable throughout Complications: No apparent complications Patient did tolerate procedure well. Bilateral Breath Sounds: Clear, Diminished   Yes  Extubated to terminal wean  Hassan Buckler Feb 11, 2020, 8:07 PM

## 2020-02-11 NOTE — Death Summary Note (Signed)
DEATH SUMMARY   Patient Details  Name: Jessica Randolph MRN: [id number] DOB: [birth year]  Admission/Discharge Information   Admit Date:  03-Feb-2020  Date of Death: Date of Death: 2020-02-03  Time of Death: Time of Death: 10-Nov-2022  Length of Stay: 1  Referring Physician: Marva Panda, NP   Reason(s) for Hospitalization   64 yoF with hx of cocaine abuse, last used last night and LSW around 2200 found unresponsive covered in emesis this morning.  Intubated in field, ongoing hypotension requiring vasopressor support, and poor neuro exam not requiring sedation.  CT head revealed large SAH.    Diagnoses  Preliminary cause of death: SAH (subarachnoid hemorrhage) (HCC) Secondary Diagnoses (including complications and co-morbidities):  Active Problems:   Acute encephalopathy   SAH (subarachnoid hemorrhage) (HCC)   IVH (intraventricular hemorrhage) (HCC)   Brain death   Cocaine use   Brief Hospital Course (including significant findings, care, treatment, and services provided and events leading to death)  Jessica Randolph is a 64 y.o. year old female who HPI obtained from ER staff, medical chart review and per patients daughter, Jessica Randolph and patient's boyfriend.   64 year old female with prior history of cocaine abuse, HLD, arthritis, asthma, chronic fatigue syndrome, fibromyalgia, GERD, and diverticulosis presenting to ER after being found unresponsive.    Patient has one daughter and lives with her boyfriend of many years.  He states that she has complained of neck and back pain recently but that is not new for her and that she has been taking motrin.  Also states last use of cocaine was last night, that this is a regular habit for them, and he last seen her seen normal around 2200 on 7/11, as they sleep in different bedrooms.  Daughter states that her mother did drugs when she was younger but was unaware she was still using.  This morning he found her unresponsive covered in emesis.  Found by  EMS unresponsive, CBG ok, hypotensive, unresponsive to narcan, dilated/ fixed pupils, and was intubated in field.  On arrival to ER, she was normothermic, ongoing hypotensive, normal sinus rhythm, saturating well on minimal ventilator support but remained unresponsive and not requiring sedation.  Labs pending but significant thus far for WBC 13.5, INR normal, LA 5.2, negative acetaminophen, BNP 317, ETOH neg, UDS positive for cocaine.  CMP pending.  PCCM called for admit.   Started on low dose peripheral levophed for ongoing hypotension despite 4 L NS.  Good UOP in ER and noted foley temperature to be dropping, currently 93.6.  =CT head performed which showed subarachnoid hemorrhage noted in the basal cisterns and bilateral sylvian fissures with large amount of hemorrhage in the lateral, third, and fourth ventricles and mild dilation of the lateral ventricles and possible intraparenchymal hemorrhage in the left cerebellar hemisphere.   Neurology consulted.   CT findings with terminal brain insult. Severe SAH and IVH with herniation. Clinical exam consistent with brain death.   Discussed results with family. They wanted to withdrawal care and compassionate liberation from vent. Patient was admitted to ICU to await family arrival to hospital.   Patient passed peacefully with family at bedside in the icu.    Pertinent Labs and Studies  Significant Diagnostic Studies CT Head Wo Contrast  Addendum Date: 2020-02-03   ADDENDUM REPORT: Feb 03, 2020 13:45 ADDENDUM: There is moderate to severe dilatation of the temporal horns suggesting significant ventriculomegaly secondary to obstruction. There is also noted compression of the brainstem due to surrounding hemorrhage. Periventricular white  matter edema is also noted with probable sulcal effacement as well. Electronically Signed   By: Lupita Raider M.D.   On: February 09, 2020 13:45   Result Date: February 09, 2020 CLINICAL DATA:  Altered mental status. EXAM: CT HEAD  WITHOUT CONTRAST TECHNIQUE: Contiguous axial images were obtained from the base of the skull through the vertex without intravenous contrast. COMPARISON:  None. FINDINGS: Brain: Large amount of hemorrhage is noted within the lateral, third and fourth ventricles. Mild dilatation of the ventricles is noted. Crescent-shaped hemorrhage is noted in the inferior portion of left cerebellar hemisphere suggesting the possibility of intraparenchymal hemorrhage. No midline shift is noted. Mild chronic ischemic white matter disease is noted. Subarachnoid hemorrhage is also noted in the basal cisterns and bilateral sylvian fissures. Vascular: No hyperdense vessel or unexpected calcification. Skull: Normal. Negative for fracture or focal lesion. Sinuses/Orbits: No acute finding. Other: None. IMPRESSION: Large amount of hemorrhage is noted within the lateral, third and fourth ventricles. Mild dilatation of the lateral ventricles is noted suggesting obstruction. Possible intraparenchymal hemorrhage seen in left cerebellar hemisphere. Subarachnoid hemorrhage is also noted in the basal cisterns and bilateral Sylvian fissures. Critical Value/emergent results were called by telephone at the time of interpretation on 02/09/2020 at 12:40 pm to provider [facility] , who verbally acknowledged these results. Electronically Signed: By: Lupita Raider M.D. On: 02-09-20 12:41   DG Chest Port 1 View  Result Date: 02-09-20 CLINICAL DATA:  Status post intubation today. EXAM: PORTABLE CHEST 1 VIEW COMPARISON:  Single-view of the chest 07/15/2016. FINDINGS: Endotracheal tube is in place in good position with the tip at the clavicular heads. NG tube side port is seen at the gastroesophageal junction. The tube should be advanced 4-5 cm. Lungs are clear. Heart size is normal. No pneumothorax or pleural fluid. Difficult later pads noted. IMPRESSION: ETT in good position. NG tube side port is at the gastroesophageal junction. The tube should  be advanced 4-5 cm. Lungs are clear. Electronically Signed   By: Drusilla Kanner M.D.   On: 2020/02/09 11:38    Microbiology Recent Results (from the past 240 hour(s))  Urine Culture     Status: None   Collection Time: 02/09/2020 11:40 AM   Specimen: Urine, Random  Result Value Ref Range Status   Specimen Description URINE, RANDOM  Final   Special Requests NONE  Final   Culture   Final    NO GROWTH Performed at [facility], [address] [place], [place] [address]    Report Status 01/23/2020 FINAL  Final  Culture, blood (routine x 2)     Status: None (Preliminary result)   Collection Time: 02-09-20 11:45 AM   Specimen: BLOOD LEFT HAND  Result Value Ref Range Status   Specimen Description BLOOD LEFT HAND  Final   Special Requests   Final    BOTTLES DRAWN AEROBIC AND ANAEROBIC Blood Culture adequate volume   Culture   Final    NO GROWTH < 24 HOURS Performed at [facility], [address], [place], [place] [address]    Report Status PENDING  Incomplete  Culture, blood (routine x 2)     Status: None (Preliminary result)   Collection Time: 2020-02-09 11:50 AM   Specimen: BLOOD RIGHT HAND  Result Value Ref Range Status   Specimen Description BLOOD RIGHT HAND  Final   Special Requests   Final    BOTTLES DRAWN AEROBIC AND ANAEROBIC Blood Culture adequate volume   Culture  Final    NO GROWTH < 24 HOURS Performed at [facility], [address] [place], [place] [address]    Report Status PENDING  Incomplete  SARS Coronavirus 2 by RT PCR (hospital order, performed in [facility] lab) Nasopharyngeal Nasopharyngeal Swab     Status: None   Collection Time: Oct 28, 2019 12:07 PM   Specimen: Nasopharyngeal Swab  Result Value Ref Range Status   SARS Coronavirus 2 NEGATIVE NEGATIVE Final    Comment: (NOTE) SARS-CoV-2 target nucleic acids are NOT DETECTED.  The SARS-CoV-2 RNA is generally detectable in upper and lower respiratory specimens during the acute  phase of infection. The lowest concentration of SARS-CoV-2 viral copies this assay can detect is 250 copies / mL. A negative result does not preclude SARS-CoV-2 infection and should not be used as the sole basis for treatment or other patient management decisions.  A negative result may occur with improper specimen collection / handling, submission of specimen other than nasopharyngeal swab, presence of viral mutation(s) within the areas targeted by this assay, and inadequate number of viral copies (<250 copies / mL). A negative result must be combined with clinical observations, patient history, and epidemiological information.  Fact Sheet for Patients:   BoilerBrush.com.cyhttps://www.fda.gov/media/136312/download  Fact Sheet for Healthcare Providers: https://pope.com/https://www.fda.gov/media/136313/download  This test is not yet approved or  cleared by the [place] FDA and has been authorized for detection and/or diagnosis of SARS-CoV-2 by FDA under an Emergency Use Authorization (EUA).  This EUA will remain in effect (meaning this test can be used) for the duration of the COVID-19 declaration under Section 564(b)(1) of the Act, 21 U.S.C. section 360bbb-3(b)(1), unless the authorization is terminated or revoked sooner.  Performed at [facility], [address] [place], [place] [address]   MRSA PCR Screening     Status: None   Collection Time: Oct 28, 2019  6:26 PM   Specimen: Nasal Mucosa; Nasopharyngeal  Result Value Ref Range Status   MRSA by PCR NEGATIVE NEGATIVE Final    Comment:        The GeneXpert MRSA Assay (FDA approved for NASAL specimens only), is one component of a comprehensive MRSA colonization surveillance program. It is not intended to diagnose MRSA infection nor to guide or monitor treatment for MRSA infections. Performed at [facility], [address] [place], [place] [address]     Lab Basic Metabolic Panel: Recent Labs  Lab Oct 28, 2019 1133 Oct 28, 2019 1150  Oct 28, 2019 1358 Oct 28, 2019 1541  NA 142 146*  --  152*  K 3.7 3.6  --  4.3  CL 107  --   --   --   CO2 19*  --   --   --   GLUCOSE 239*  --   --   --   BUN 16  --   --   --   CREATININE 1.07*  --   --   --   CALCIUM 8.6*  --   --   --   MG  --   --  1.8  --    Liver Function Tests: Recent Labs  Lab Oct 28, 2019 1133  AST 32  ALT 14  ALKPHOS 67  BILITOT 0.6  PROT 6.8  ALBUMIN 4.0   Recent Labs  Lab Oct 28, 2019 1133  LIPASE 21   Recent Labs  Lab Oct 28, 2019 1358  AMMONIA 28   CBC: Recent Labs  Lab Oct 28, 2019 1133 Oct 28, 2019 1150 Oct 28, 2019 1541  WBC 13.5*  --   --  NEUTROABS 12.3*  --   --   HGB 12.4 8.8* 10.9*  HCT 40.5 26.0* 32.0*  MCV 96.9  --   --   PLT 332  --   --    Cardiac Enzymes: No results for input(s): CKTOTAL, CKMB, CKMBINDEX, TROPONINI in the last 168 hours. Sepsis Labs: Recent Labs  Lab February 21, 2020 1133 2020/02/21 1138 Feb 21, 2020 1358  PROCALCITON  --   --  0.26  WBC 13.5*  --   --   LATICACIDVEN  --  5.2* 1.4    Procedures/Operations  ETT by EMS    Elige Radon L Mauri Tolen 01/23/2020, 4:24 PM

## 2020-02-11 DEATH — deceased

## 2020-03-02 IMAGING — CT CT ABD-PELV W/ CM
2 of 5 series · 17 of 46 positions shown, 19 images · IV contrast (ISOVUE)
Comparison: 03/13/2012

CLINICAL DATA: Abdominal pain, history of ulcerative colitis.

EXAM:
CT ABDOMEN AND PELVIS WITH CONTRAST
TECHNIQUE: Multidetector CT imaging of the abdomen and pelvis was performed
using the standard protocol following bolus administration of
intravenous contrast.
CONTRAST:  100 cc [id number] IOPAMIDOL ([id number]-[id number]) INJECTION 61%

[Series 2: axial st · axial · 0.81mm/px · z∈[+967,+1342]mm · 14 of 86 slices shown, 16 images]
[im 6/86  soft-tissue]
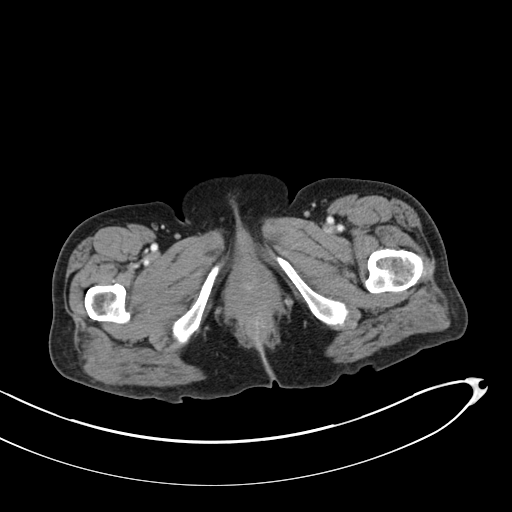
[im 6/86  bone]
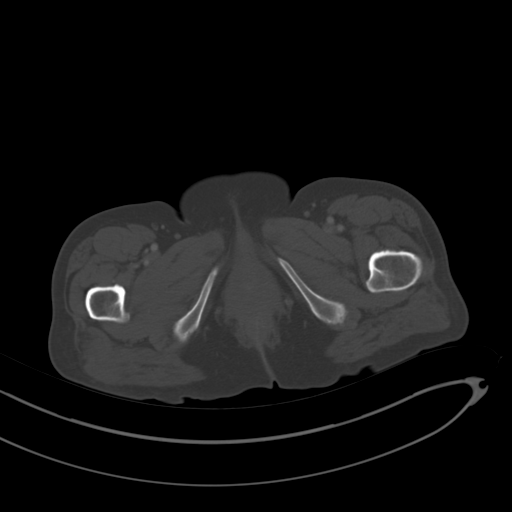
[im 11/86  soft-tissue]
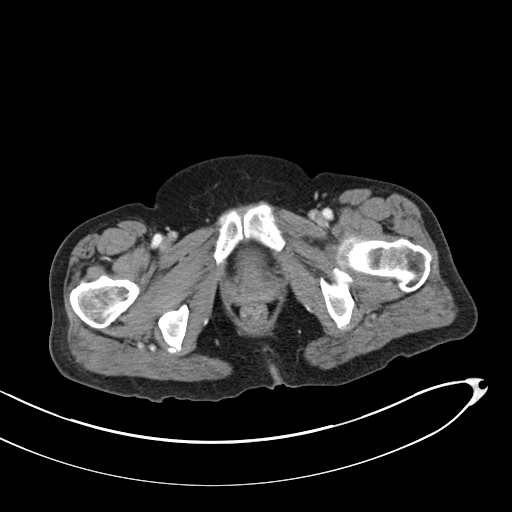
[im 16/86  soft-tissue]
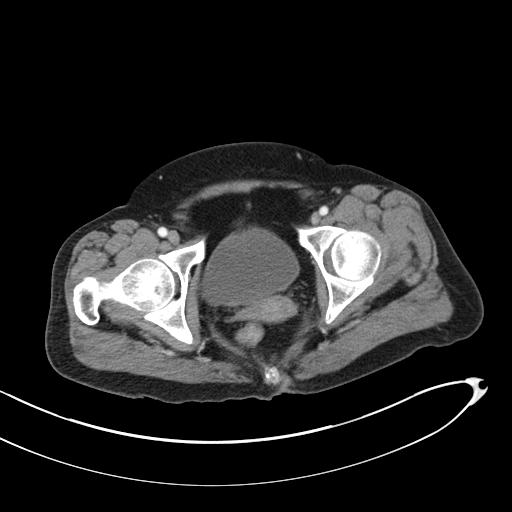
[im 26/86  soft-tissue]
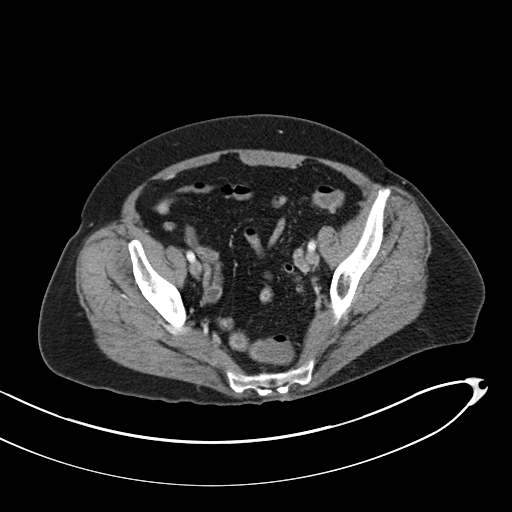
[im 31/86  soft-tissue]
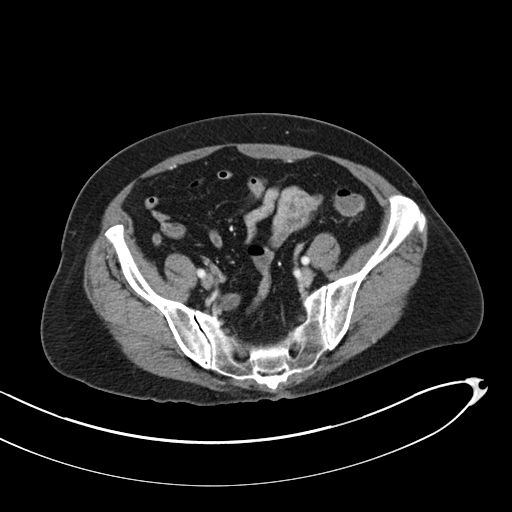
[im 36/86  soft-tissue]
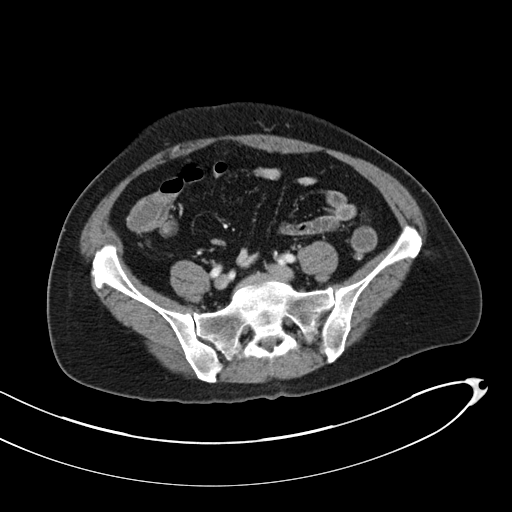
[im 41/86  soft-tissue]
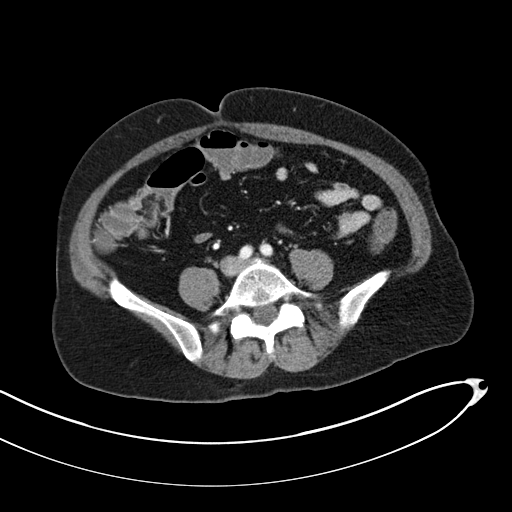
[im 46/86  soft-tissue]
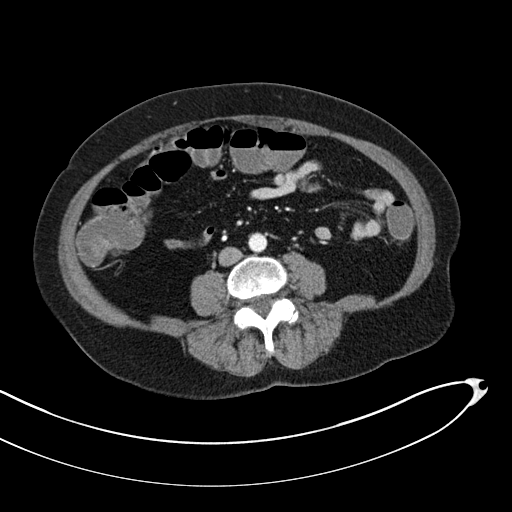
[im 51/86  soft-tissue]
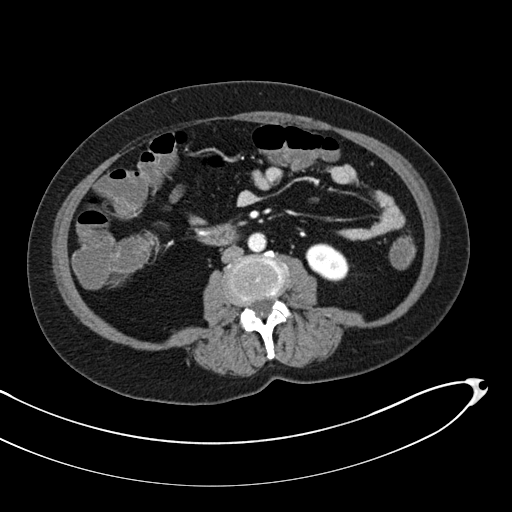
[im 51/86  bone]
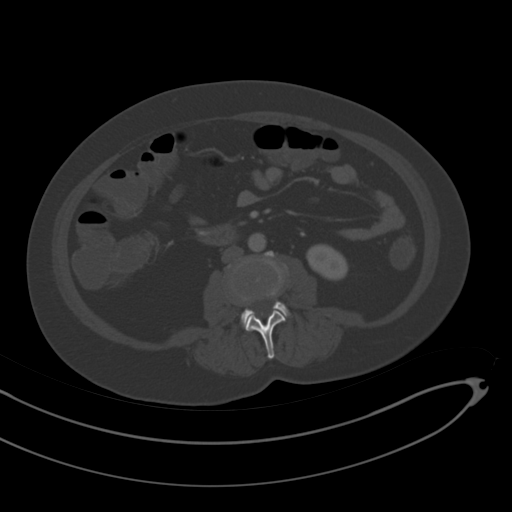
[im 56/86  soft-tissue]
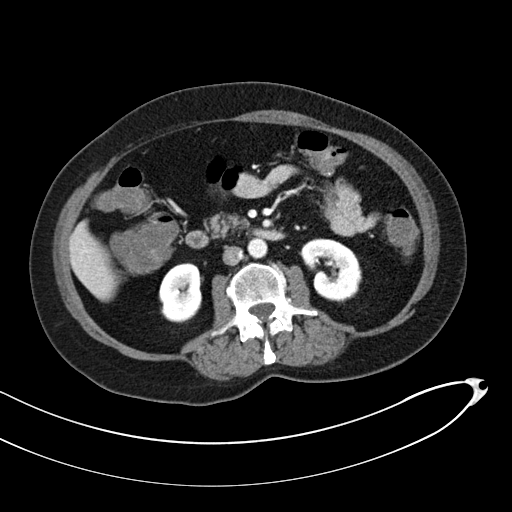
[im 66/86  soft-tissue]
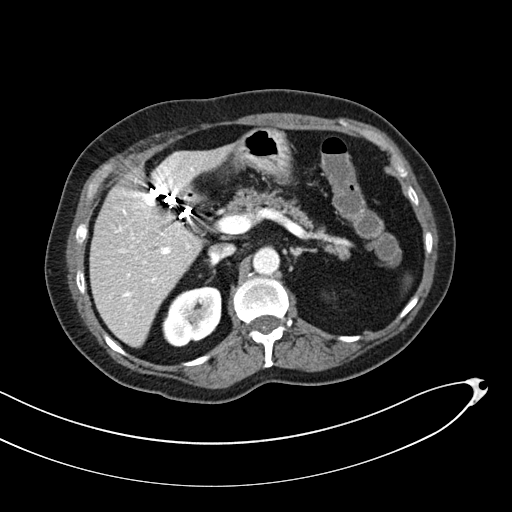
[im 71/86  soft-tissue]
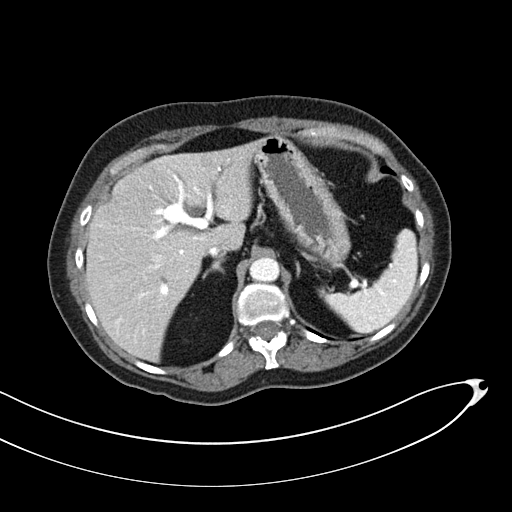
[im 76/86  soft-tissue]
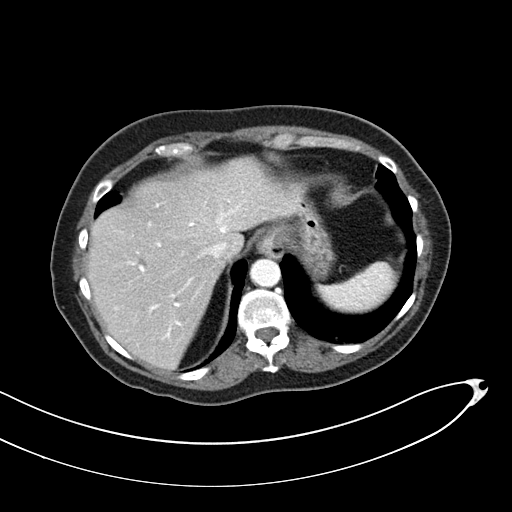
[im 81/86  soft-tissue]
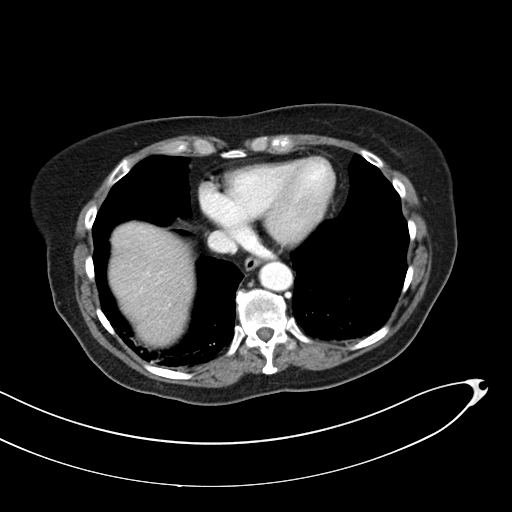

[Series 4: coronal st · coronal · 0.82mm/px · 3 of 101 slices shown]
[im 34/101  soft-tissue]
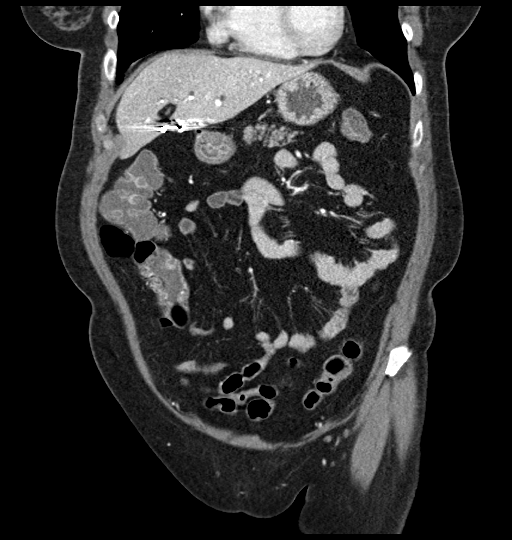
[im 45/101  soft-tissue]
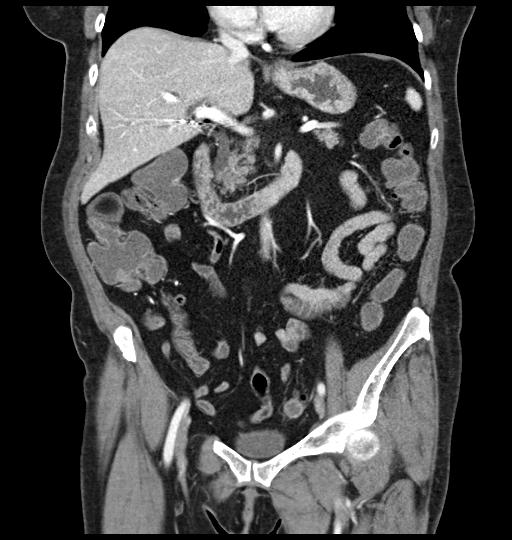
[im 56/101  soft-tissue]
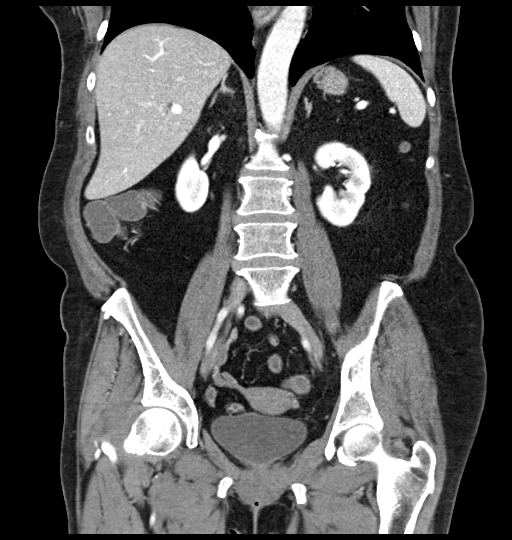

[17 of 46 positions shown; findings below may reference images not displayed]

FINDINGS: Lower chest: Normal size heart without pericardial effusion.
Bibasilar dependent atelectasis.

Hepatobiliary: Cholecystectomy. No space-occupying mass of the
liver. There is mild intrahepatic ductal dilatation likely
representing reservoir effect status post cholecystectomy.

Pancreas: Mild atrophy of the pancreas.  No inflammation or mass.

Spleen: Normal

Adrenals/Urinary Tract: Normal bilateral adrenal glands and kidneys
without obstructive uropathy. In located right ureter. The urinary
bladder is unremarkable for the degree of distention.

Stomach/Bowel: Small hiatal hernia. Decompressed stomach with normal
small bowel rotation. Normal appendix. No small bowel inflammation.
Liquid stool within the colon without mural thickening consistent
with diarrheal disease. Scattered colonic diverticulosis is noted.

Vascular/Lymphatic: No aortic aneurysm or dissection. No adenopathy.

Reproductive: Coarse rounded calcification in the uterus measuring 3
mm in diameter likely representing a small calcified fibroid. No
adnexal mass.

Other: No free air nor free fluid.

Musculoskeletal: No acute nor aggressive osseous lesions.
IMPRESSION: 1. Liquid stool within the colon compatible with diarrheal disease.
No significant mural thickening or inflammation.
2. Status post cholecystectomy.
3. Calcified uterine fibroid.

## 2020-11-12 NOTE — Telephone Encounter (Signed)
See phone note

## 2021-12-25 IMAGING — DX DG CHEST 1V PORT
1 series · 1 of 1 positions shown · non-contrast
Comparison: Single-view of the chest 07/15/2016.

CLINICAL DATA: Status post intubation today.

EXAM:
PORTABLE CHEST 1 VIEW

[chest]
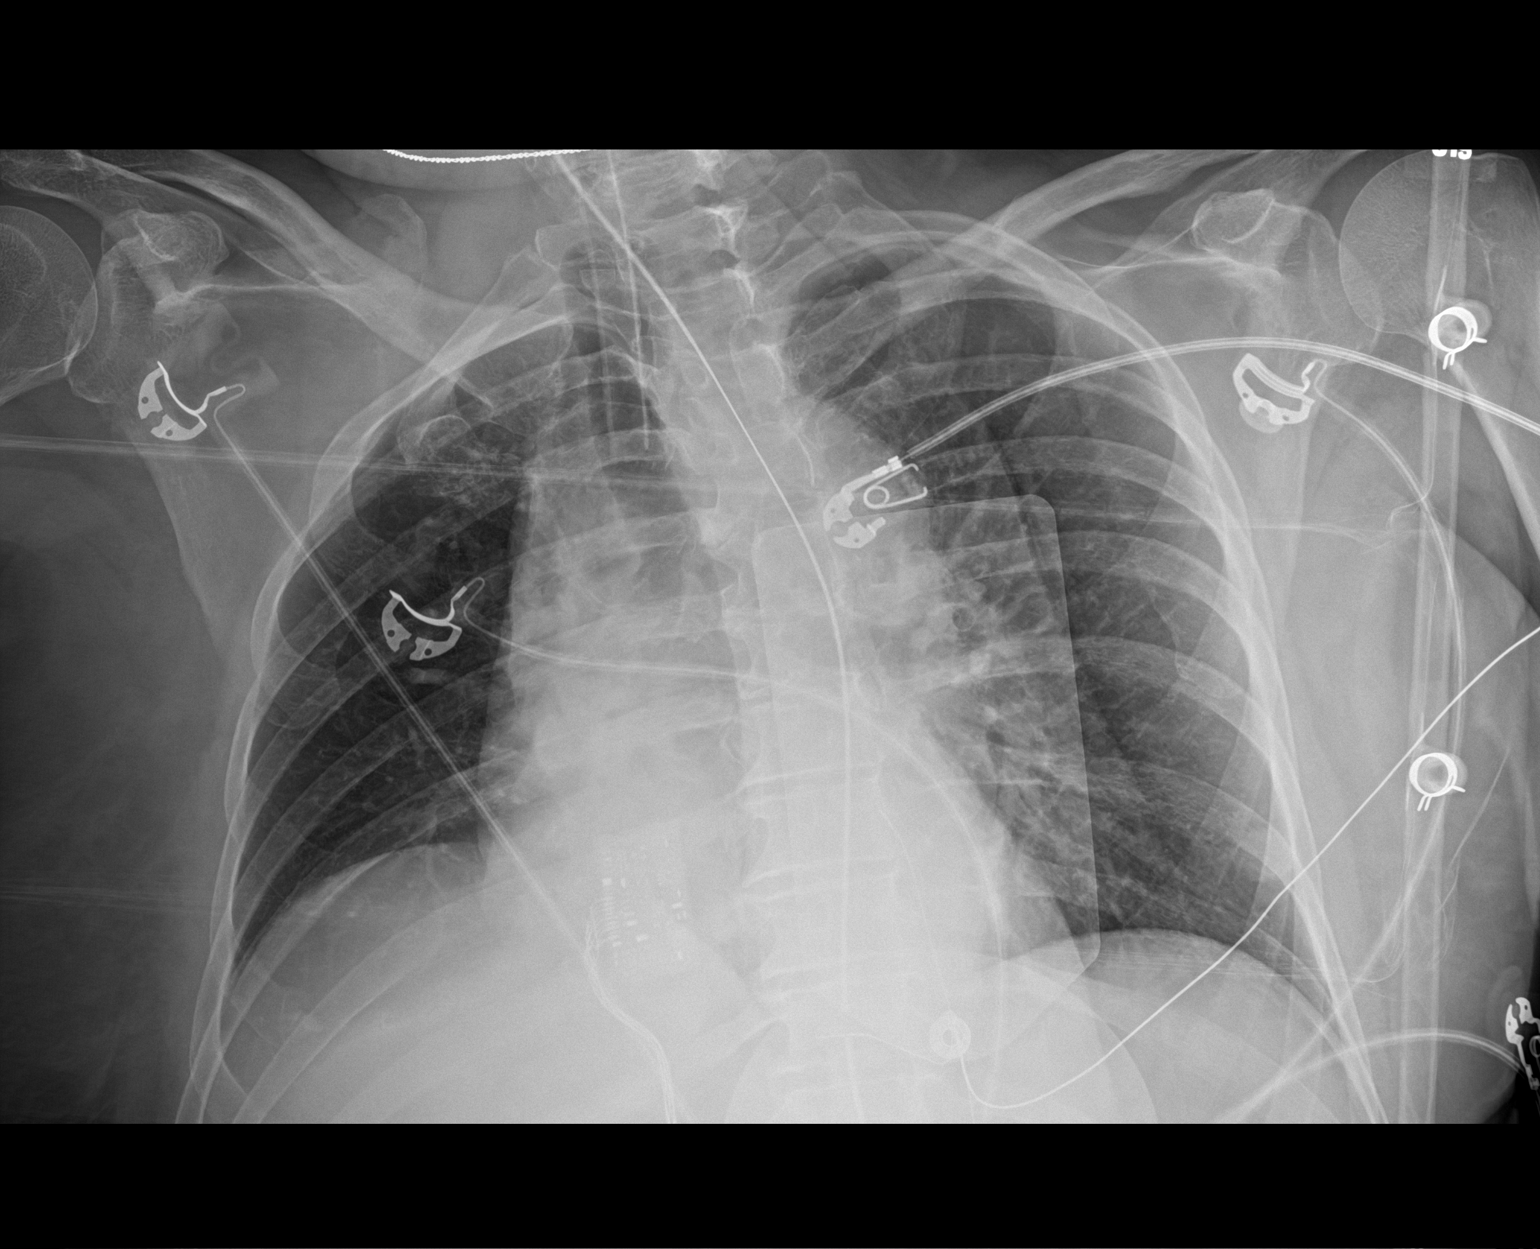

[1 of 1 positions shown; findings below may reference images not displayed]

FINDINGS: Endotracheal tube is in place in good position with the tip at the
clavicular heads. NG tube side port is seen at the gastroesophageal
junction. The tube should be advanced 4-5 cm. Lungs are clear. Heart
size is normal. No pneumothorax or pleural fluid. Difficult later
pads noted.
IMPRESSION: ETT in good position.

NG tube side port is at the gastroesophageal junction. The tube
should be advanced 4-5 cm.

Lungs are clear.

## 2021-12-25 IMAGING — CT CT HEAD W/O CM
4 series · 14 of 47 positions shown, 16 images · non-contrast
Comparison: None.
COMPARISON: None.

Addendum:
CLINICAL DATA: Altered mental status.

EXAM:
CT HEAD WITHOUT CONTRAST
TECHNIQUE: Contiguous axial images were obtained from the base of the skull
through the vertex without intravenous contrast.

[Series 3: head wo · axial · 0.43mm/px · z∈[-143,-23]mm · 7 of 32 slices shown, 9 images]
[im 4/32  brain]
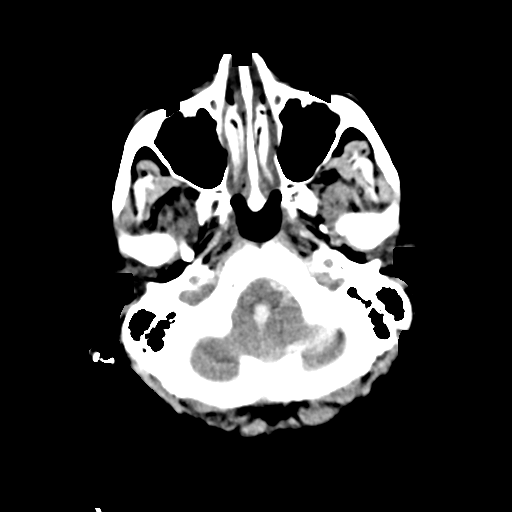
[im 4/32  bone]
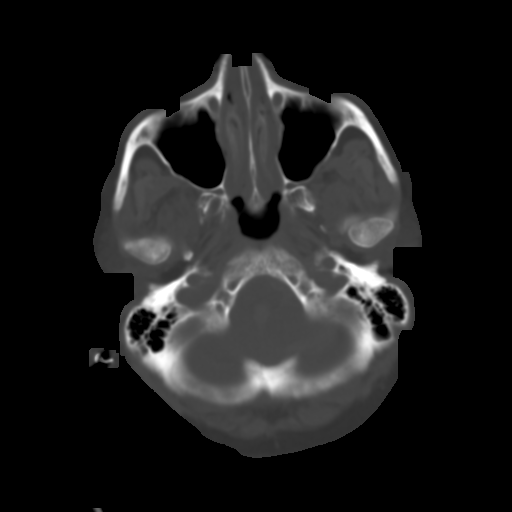
[im 8/32  brain]
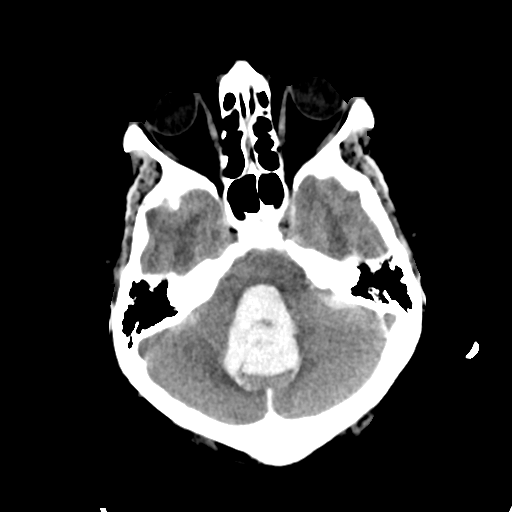
[im 12/32  brain]
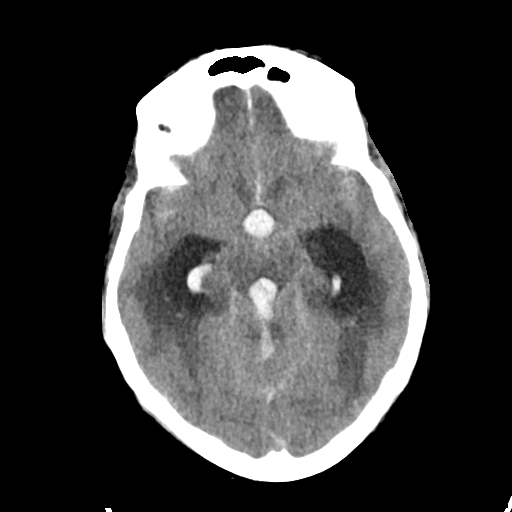
[im 16/32  brain]
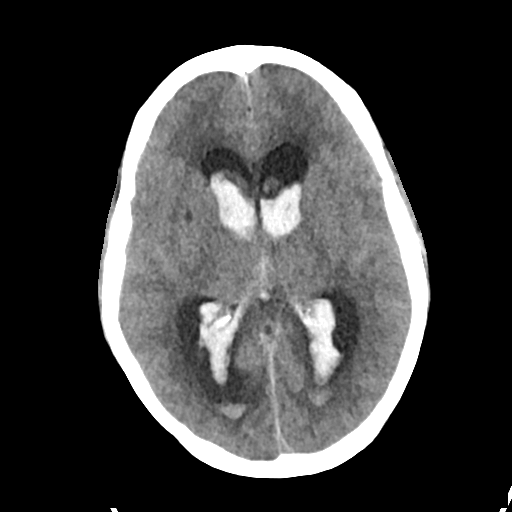
[im 20/32  brain]
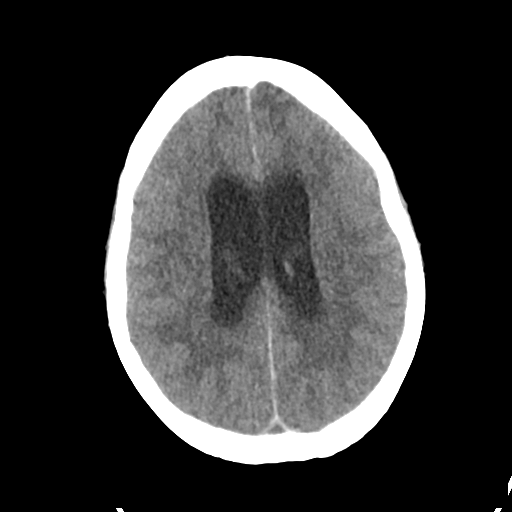
[im 20/32  bone]
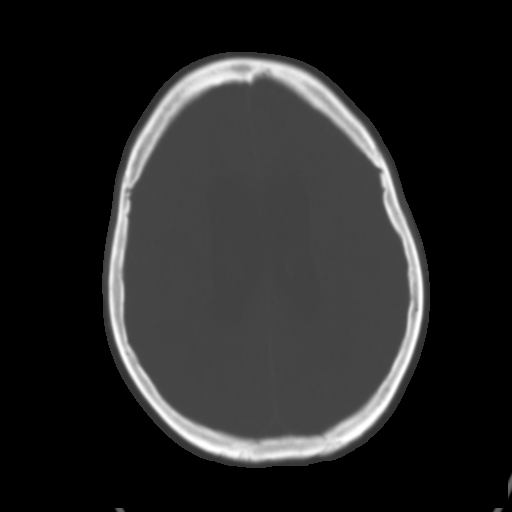
[im 24/32  brain]
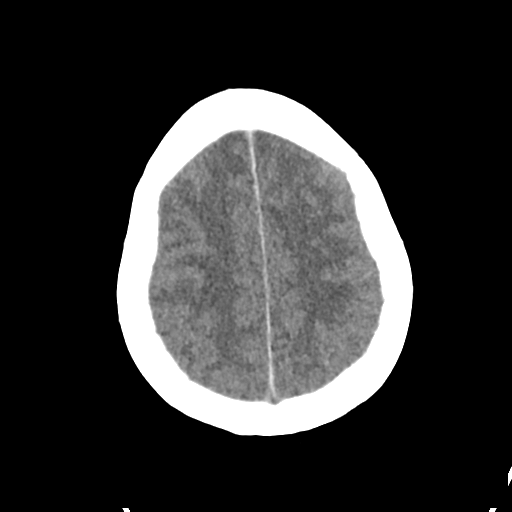
[im 28/32  brain]
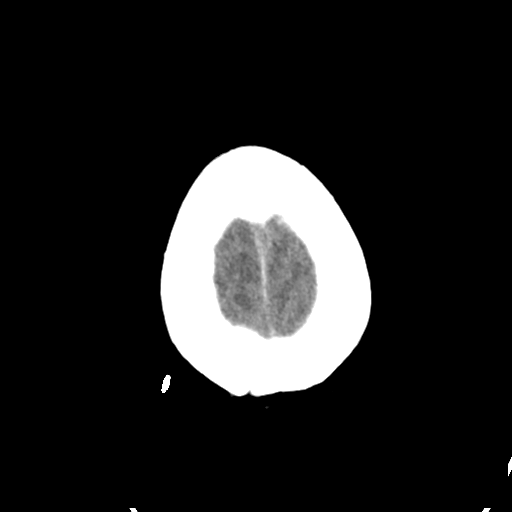

[Series 4: head bone · axial · 0.43mm/px · 1 of 80 slices shown]
[im 8/80  bone]
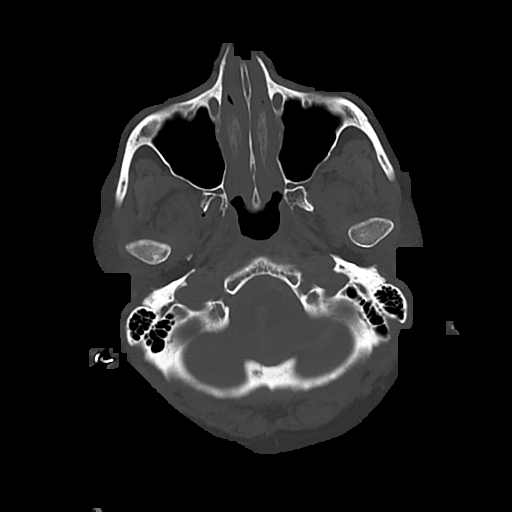

[Series 5: cor soft · coronal · 0.35mm/px · 3 of 68 slices shown]
[im 23/68  brain]
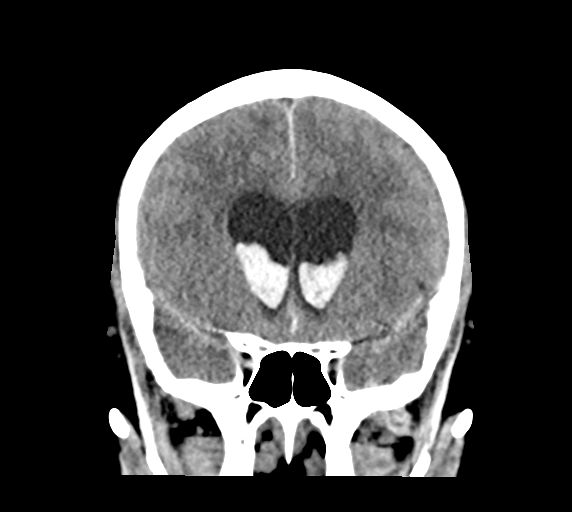
[im 30/68  brain]
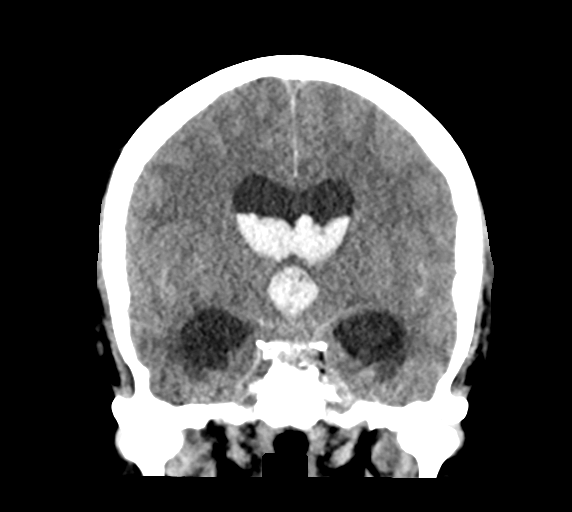
[im 38/68  brain]
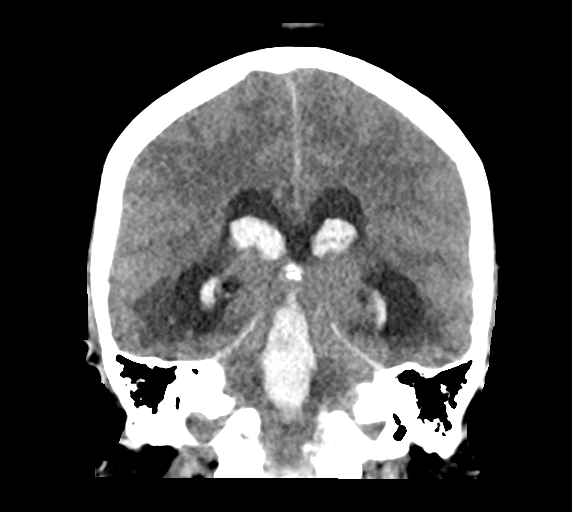

[Series 6: sag soft · sagittal · 0.33mm/px · 3 of 52 slices shown]
[im 18/52  brain]
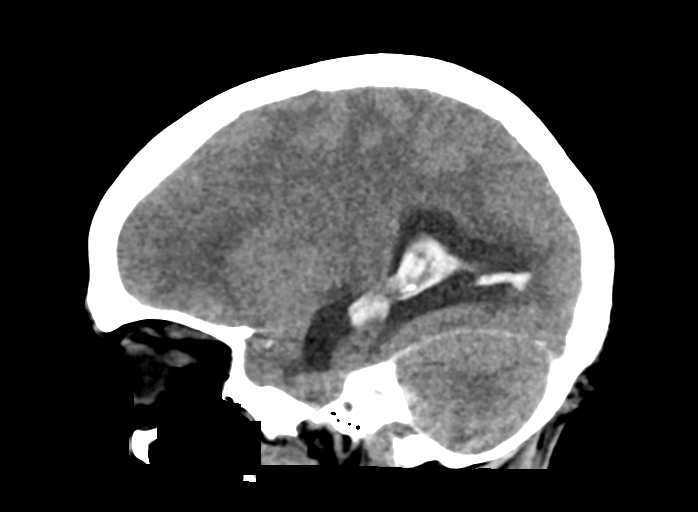
[im 26/52  brain]
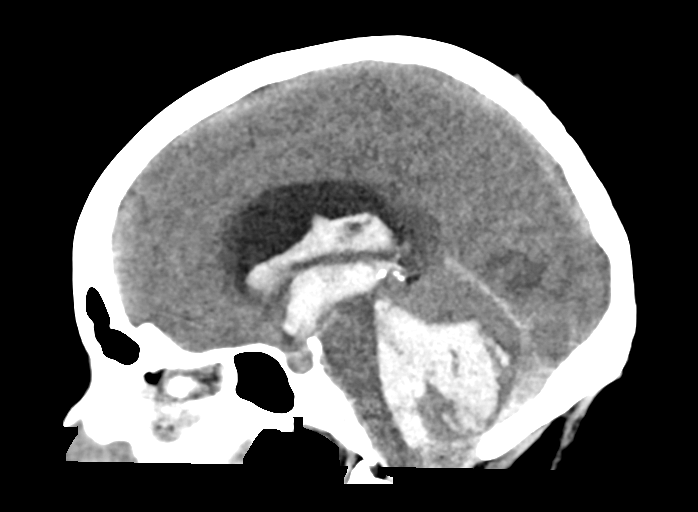
[im 35/52  brain]
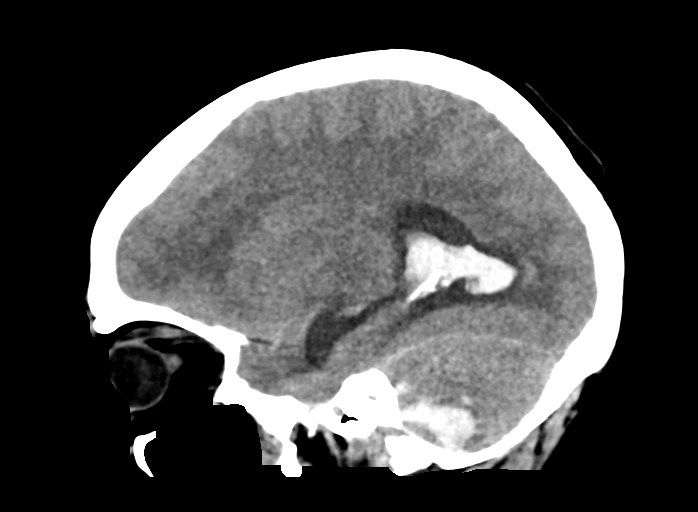

[14 of 47 positions shown; findings below may reference images not displayed]

FINDINGS: Brain: Large amount of hemorrhage is noted within the lateral, third
and fourth ventricles. Mild dilatation of the ventricles is noted.
Crescent-shaped hemorrhage is noted in the inferior portion of left
cerebellar hemisphere suggesting the possibility of intraparenchymal
hemorrhage. No midline shift is noted. Mild chronic ischemic white
matter disease is noted. Subarachnoid hemorrhage is also noted in
the basal cisterns and bilateral sylvian fissures.

Vascular: No hyperdense vessel or unexpected calcification.

Skull: Normal. Negative for fracture or focal lesion.

Sinuses/Orbits: No acute finding.

Other: None.
IMPRESSION: Large amount of hemorrhage is noted within the lateral, third and
fourth ventricles. Mild dilatation of the lateral ventricles is
noted suggesting obstruction. Possible intraparenchymal hemorrhage
seen in left cerebellar hemisphere. Subarachnoid hemorrhage is also
noted in the basal cisterns and bilateral Sylvian fissures. Critical
Value/emergent results were called by telephone at the time of
interpretation on 01/22/2020 at [DATE] to provider PONYANA SASAH ,
who verbally acknowledged these results.

ADDENDUM:
There is moderate to severe dilatation of the temporal horns
suggesting significant ventriculomegaly secondary to obstruction.
There is also noted compression of the brainstem due to surrounding
hemorrhage. Periventricular white matter edema is also noted with
probable sulcal effacement as well.

*** End of Addendum ***
FINDINGS: Brain: Large amount of hemorrhage is noted within the lateral, third
and fourth ventricles. Mild dilatation of the ventricles is noted.
Crescent-shaped hemorrhage is noted in the inferior portion of left
cerebellar hemisphere suggesting the possibility of intraparenchymal
hemorrhage. No midline shift is noted. Mild chronic ischemic white
matter disease is noted. Subarachnoid hemorrhage is also noted in
the basal cisterns and bilateral sylvian fissures.

Vascular: No hyperdense vessel or unexpected calcification.

Skull: Normal. Negative for fracture or focal lesion.

Sinuses/Orbits: No acute finding.

Other: None.
IMPRESSION: Large amount of hemorrhage is noted within the lateral, third and
fourth ventricles. Mild dilatation of the lateral ventricles is
noted suggesting obstruction. Possible intraparenchymal hemorrhage
seen in left cerebellar hemisphere. Subarachnoid hemorrhage is also
noted in the basal cisterns and bilateral Sylvian fissures. Critical
Value/emergent results were called by telephone at the time of
interpretation on 01/22/2020 at [DATE] to provider PONYANA SASAH ,
who verbally acknowledged these results.
